# Patient Record
Sex: Male | Born: 1965 | Race: White | Hispanic: No | Marital: Married | Smoking: Current every day smoker
Health system: Southern US, Community
[De-identification: ages and names within clinical notes are randomized; demographics above are authoritative.]

## PROBLEM LIST (undated history)

## (undated) DIAGNOSIS — Z789 Other specified health status: Secondary | ICD-10-CM

## (undated) HISTORY — PX: OTHER SURGICAL HISTORY: SHX169

## (undated) HISTORY — PX: BACK SURGERY: SHX140

---

## 2004-01-28 ENCOUNTER — Ambulatory Visit (HOSPITAL_COMMUNITY): Admission: RE | Admit: 2004-01-28 | Discharge: 2004-01-29 | Payer: Self-pay | Admitting: Neurosurgery

## 2006-03-13 ENCOUNTER — Emergency Department (HOSPITAL_COMMUNITY): Admission: EM | Admit: 2006-03-13 | Discharge: 2006-03-13 | Payer: Self-pay | Admitting: Emergency Medicine

## 2006-10-18 ENCOUNTER — Ambulatory Visit (HOSPITAL_COMMUNITY): Admission: RE | Admit: 2006-10-18 | Discharge: 2006-10-19 | Payer: Self-pay | Admitting: Neurosurgery

## 2011-02-22 ENCOUNTER — Emergency Department (INDEPENDENT_AMBULATORY_CARE_PROVIDER_SITE_OTHER): Payer: Self-pay

## 2011-02-22 ENCOUNTER — Emergency Department (HOSPITAL_BASED_OUTPATIENT_CLINIC_OR_DEPARTMENT_OTHER)
Admission: EM | Admit: 2011-02-22 | Discharge: 2011-02-22 | Disposition: A | Discharge status: Home / Self Care | Payer: Self-pay | Attending: Emergency Medicine | Admitting: Emergency Medicine

## 2011-02-22 DIAGNOSIS — R079 Chest pain, unspecified: Secondary | ICD-10-CM

## 2011-02-22 DIAGNOSIS — F17200 Nicotine dependence, unspecified, uncomplicated: Secondary | ICD-10-CM

## 2011-02-22 DIAGNOSIS — R071 Chest pain on breathing: Secondary | ICD-10-CM | POA: Insufficient documentation

## 2011-02-22 DIAGNOSIS — G8929 Other chronic pain: Secondary | ICD-10-CM | POA: Insufficient documentation

## 2011-02-22 LAB — POCT CARDIAC MARKERS
CKMB, poc: <1 ng/mL — ABNORMAL LOW (ref 1.0–8.0)
CKMB, poc: <1 ng/mL — ABNORMAL LOW (ref 1.0–8.0)
CKMB, poc: <1 ng/mL — ABNORMAL LOW (ref 1.0–8.0)
Myoglobin, poc: 44.4 ng/mL (ref 12–200)
Troponin i, poc: <0.05 ng/mL (ref 0.00–0.09)
Troponin i, poc: <0.05 ng/mL (ref 0.00–0.09)

## 2011-02-22 LAB — BASIC METABOLIC PANEL
BUN: 17 mg/dL (ref 6–23)
GFR calc Af Amer: >60 mL/min (ref >60)
Potassium: 4 mEq/L (ref 3.5–5.1)

## 2011-02-22 LAB — CBC
Hemoglobin: 14.9 g/dL (ref 12.0–15.0)
MCHC: 34.2 g/dL (ref 30.0–36.0)
Platelets: 390 10*3/uL (ref 150–400)
RBC: 5 MIL/uL (ref 3.87–5.11)

## 2011-05-27 ENCOUNTER — Encounter (HOSPITAL_COMMUNITY)
Admission: RE | Admit: 2011-05-27 | Discharge: 2011-05-27 | Disposition: A | Discharge status: Home / Self Care | Payer: 59 | Source: Ambulatory Visit | Attending: Neurosurgery | Admitting: Neurosurgery

## 2011-05-27 LAB — CBC
HCT: 40.4 % (ref 39.0–52.0)
Hemoglobin: 13.8 g/dL (ref 13.0–17.0)
MCH: 30.4 pg (ref 26.0–34.0)
MCHC: 34.2 g/dL (ref 30.0–36.0)
MCV: 89 fL (ref 78.0–100.0)
Platelets: 414 10*3/uL — ABNORMAL HIGH (ref 150–400)
RBC: 4.54 MIL/uL (ref 4.22–5.81)
RDW: 12.7 % (ref 11.5–15.5)
WBC: 8.8 10*3/uL (ref 4.0–10.5)

## 2011-05-27 LAB — SURGICAL PCR SCREEN
MRSA, PCR: NEGATIVE
Staphylococcus aureus: NEGATIVE

## 2011-06-01 ENCOUNTER — Ambulatory Visit (HOSPITAL_COMMUNITY)
Admission: RE | Admit: 2011-06-01 | Discharge: 2011-06-02 | Disposition: A | Discharge status: Home / Self Care | Payer: 59 | Source: Ambulatory Visit | Attending: Neurosurgery | Admitting: Neurosurgery

## 2011-06-01 ENCOUNTER — Ambulatory Visit (HOSPITAL_COMMUNITY): Payer: 59

## 2011-06-01 DIAGNOSIS — M51379 Other intervertebral disc degeneration, lumbosacral region without mention of lumbar back pain or lower extremity pain: Secondary | ICD-10-CM | POA: Insufficient documentation

## 2011-06-01 DIAGNOSIS — Z01812 Encounter for preprocedural laboratory examination: Secondary | ICD-10-CM | POA: Insufficient documentation

## 2011-06-01 DIAGNOSIS — M47817 Spondylosis without myelopathy or radiculopathy, lumbosacral region: Secondary | ICD-10-CM | POA: Insufficient documentation

## 2011-06-01 DIAGNOSIS — M5126 Other intervertebral disc displacement, lumbar region: Secondary | ICD-10-CM | POA: Insufficient documentation

## 2011-06-01 DIAGNOSIS — M5137 Other intervertebral disc degeneration, lumbosacral region: Secondary | ICD-10-CM | POA: Insufficient documentation

## 2011-06-06 NOTE — Op Note (Signed)
Jason Morrison, OTTEN NO.:  [id number]  MEDICAL RECORD NO.:  [id number]  LOCATION:  3526                         FACILITY:  [facility]  PHYSICIAN:  Hewitt Shorts, M.D.DATE OF BIRTH:  01-Aug-1966  DATE OF PROCEDURE:  06/01/2011 DATE OF DISCHARGE:                              OPERATIVE REPORT   PREOPERATIVE DIAGNOSES:  Recurrent left L5-S1 lumbar disk herniation, lumbar degenerative disk disease, lumbar spondylosis, and lumbar radiculopathy.  POSTOPERATIVE DIAGNOSES:  Recurrent left L5-S1 lumbar disk herniation, lumbar degenerative disk disease, lumbar spondylosis, and lumbar radiculopathy.  PROCEDURE:  Left L5-S1 lumbar laminotomy, microdiskectomy with microdissection and the operating microscope.  SURGEON:  Hewitt Shorts, MD  ASSISTANT:  Danae Orleans. Venetia Maxon, MD  ANESTHESIA:  General endotracheal.  INDICATIONS:  The patient is a 45 year old man who presented with recurrent left lumbar radicular pain.  MRI scan revealed a large disk herniation migrated caudally behind the body of S1 and a decision was made to proceed with laminotomy and microdiskectomy.  PROCEDURE IN DETAIL:  The patient was brought to the operating room and placed under general endotracheal anesthesia.  The patient was turned to prone position.  Lumbar region was prepped with Betadine soap and solution and draped in a sterile fashion.  The previous midline incision was identified.  The underlying skin and subcutaneous tissue were infiltrated with local anesthetic with epinephrine.  An x-ray was taken. Localization at the L5-S1 level was confirmed and then a midline incision made in the line of the previous midline incision and carried down through the subcutaneous tissue.  Bipolar cautery and electrocautery was used to maintain hemostasis.  Dissection was carried down to the lumbar fascia which was incised on the left side of midline. The paraspinal muscles were dissected in spinous  process and lamina in a subperiosteal fashion.  The L5-S1 interlaminar space was identified. Another x-ray was taken to confirm the localization and then we defined the superior edge of the S1 lamina.  The scar tissue was dissected from that edge and then we began a laminotomy.  The operating microscope was draped and brought into the field to provide additional navigation, illumination, and visualization.  The remainder of decompression was performed using microdissection and microsurgical technique.  Using the high-speed drill and Kerrison punches, the S1 laminotomy and a medial facetectomy was performed.  There was extensive scar tissue but we were able to identify the S1 pedicle and the S1 nerve roots and gradually dissected laterally identifying the lateral aspect of the annulus of the L5-S1 disk space and then began to carefully mobilize the thecal sac and nerve root medially freeing up scar tissue and adherence between the neural structures and the surrounding structures.  As we mobilized the thecal sac and nerve root medially, we further visualized the annulus at L5-S1.  It was incised and entry to the disk space was markedly narrowed, but as we did this we were able to further mobilize the thecal sac and nerve root medially.  Eventually, we were able to dissect through some of the ventral epidural scar tissue and a herniated disk fragment was identified.  We began to mobilize it and removed a large fragment by  gently teasing it out from around the surrounding scar tissue.  This decompressed the thecal sac and nerve roots ventrally and it was felt that good decompression of the thecal sac and nerve root had been achieved by removing the large fragments.  We entered the disk space again and removed any additional loose degenerated piece of the disk material directly in our exposure and then the wound was irrigated bacitracin solution, checked for hemostasis, and was established  with the use of bipolar cautery as needed.  We did Valsalva the patient and there was no evidence of CSF leakage with the Valsalva maneuver and then we proceeded with closure.  Deep fascia closed with interrupted undyed 1- 0 Vicryl sutures.  Scarpa fascia closed with interrupted inverted 2-0 undyed Vicryl sutures.  Subcutaneous subcuticular closed with inverted 2- 0 and 3-0 undyed Vicryl sutures.  Skin was approximated with Dermabond. The procedure was tolerated well.  Estimated blood loss was less than 25 mL.  Sponge and needle count correct.  Following surgery, the patient was to be turned back to supine position, reversed from the anesthetic, extubated, and transferred to the recovery room for further care.     Hewitt Shorts, M.D.     RWN/MEDQ  D:  06/01/2011  T:  06/01/2011  Job:  [id number]  Electronically Signed by Shirlean Kelly M.D. on 06/06/2011 07:36:16 AM

## 2012-09-10 ENCOUNTER — Other Ambulatory Visit: Payer: Self-pay | Admitting: Neurosurgery

## 2012-09-11 ENCOUNTER — Encounter (HOSPITAL_COMMUNITY): Payer: Self-pay | Admitting: Pharmacy Technician

## 2012-09-13 NOTE — Pre-Procedure Instructions (Signed)
20 VARNELL ORVIS  09/13/2012   Your procedure is scheduled on:  09/17/12  Report to [facility] at 530 AM.  Call this number if you have problems the morning of surgery: [phone]   Remember:   Do not eat food:After Midnight.     Take these medicines the morning of surgery with A SIP OF WATER: hydrocodone,zantac,ultram   Do not wear jewelry, make-up or nail polish.  Do not wear lotions, powders, or perfumes. You may wear deodorant.  Do not shave 48 hours prior to surgery. Men may shave face and neck.  Do not bring valuables to the hospital.  Contacts, dentures or bridgework may not be worn into surgery.  Leave suitcase in the car. After surgery it may be brought to your room.  For patients admitted to the hospital, checkout time is 11:00 AM the day of discharge.   Patients discharged the day of surgery will not be allowed to drive home.  Name and phone number of your driver: family  Special Instructions: Shower using CHG 2 nights before surgery and the night before surgery.  If you shower the day of surgery use CHG.  Use special wash - you have one bottle of CHG for all showers.  You should use approximately 1/3 of the bottle for each shower.   Please read over the following fact sheets that you were given: Pain Booklet, Coughing and Deep Breathing, Surgical Site Infection Prevention and Anesthesia Post-op Instructions

## 2012-09-14 ENCOUNTER — Encounter (HOSPITAL_COMMUNITY): Payer: Self-pay

## 2012-09-14 ENCOUNTER — Encounter (HOSPITAL_COMMUNITY)
Admission: RE | Admit: 2012-09-14 | Discharge: 2012-09-14 | Disposition: A | Discharge status: Home / Self Care | Payer: 59 | Source: Ambulatory Visit | Attending: Neurosurgery | Admitting: Neurosurgery

## 2012-09-14 HISTORY — DX: Other specified health status: Z78.9

## 2012-09-14 LAB — BASIC METABOLIC PANEL
BUN: 9 mg/dL (ref 6–23)
CO2: 26 mEq/L (ref 19–32)
Calcium: 9.4 mg/dL (ref 8.4–10.5)
Chloride: 99 mEq/L (ref 96–112)
Creatinine, Ser: 0.82 mg/dL (ref 0.50–1.35)
GFR calc Af Amer: >90 mL/min (ref >90)
GFR calc non Af Amer: >90 mL/min (ref >90)
Glucose, Bld: 88 mg/dL (ref 70–99)
Potassium: 3.7 mEq/L (ref 3.5–5.1)
Sodium: 138 mEq/L (ref 135–145)

## 2012-09-14 LAB — CBC
HCT: 45.3 % (ref 39.0–52.0)
Hemoglobin: 15.2 g/dL (ref 13.0–17.0)
MCH: 30.9 pg (ref 26.0–34.0)
MCHC: 33.6 g/dL (ref 30.0–36.0)
MCV: 92.1 fL (ref 78.0–100.0)
Platelets: 338 10*3/uL (ref 150–400)
RBC: 4.92 MIL/uL (ref 4.22–5.81)
RDW: 12.6 % (ref 11.5–15.5)
WBC: 9.3 10*3/uL (ref 4.0–10.5)

## 2012-09-14 LAB — SURGICAL PCR SCREEN
MRSA, PCR: NEGATIVE
Staphylococcus aureus: NEGATIVE

## 2012-09-16 MED ORDER — CEFAZOLIN SODIUM-DEXTROSE 2-3 GM-% IV SOLR
2.0000 g | INTRAVENOUS | Status: AC
  Administered 2012-09-17: 2 g via INTRAVENOUS
  Filled 2012-09-16: qty 50

## 2012-09-17 ENCOUNTER — Encounter (HOSPITAL_COMMUNITY): Payer: Self-pay | Admitting: *Deleted

## 2012-09-17 ENCOUNTER — Ambulatory Visit (HOSPITAL_COMMUNITY): Payer: 59 | Admitting: Certified Registered Nurse Anesthetist

## 2012-09-17 ENCOUNTER — Observation Stay (HOSPITAL_COMMUNITY)
Admission: RE | Admit: 2012-09-17 | Discharge: 2012-09-18 | Disposition: A | Discharge status: Home / Self Care | Payer: 59 | Source: Ambulatory Visit | Attending: Neurosurgery | Admitting: Neurosurgery

## 2012-09-17 ENCOUNTER — Encounter (HOSPITAL_COMMUNITY): Payer: Self-pay | Admitting: Certified Registered Nurse Anesthetist

## 2012-09-17 ENCOUNTER — Ambulatory Visit (HOSPITAL_COMMUNITY): Payer: 59

## 2012-09-17 ENCOUNTER — Encounter (HOSPITAL_COMMUNITY)
Admission: RE | Disposition: A | Discharge status: Home / Self Care | Payer: Self-pay | Source: Ambulatory Visit | Attending: Neurosurgery

## 2012-09-17 DIAGNOSIS — J4489 Other specified chronic obstructive pulmonary disease: Secondary | ICD-10-CM | POA: Insufficient documentation

## 2012-09-17 DIAGNOSIS — M5020 Other cervical disc displacement, unspecified cervical region: Principal | ICD-10-CM | POA: Insufficient documentation

## 2012-09-17 DIAGNOSIS — F17200 Nicotine dependence, unspecified, uncomplicated: Secondary | ICD-10-CM | POA: Insufficient documentation

## 2012-09-17 DIAGNOSIS — M5030 Other cervical disc degeneration, unspecified cervical region: Secondary | ICD-10-CM | POA: Insufficient documentation

## 2012-09-17 DIAGNOSIS — Z01812 Encounter for preprocedural laboratory examination: Secondary | ICD-10-CM | POA: Insufficient documentation

## 2012-09-17 DIAGNOSIS — M47812 Spondylosis without myelopathy or radiculopathy, cervical region: Secondary | ICD-10-CM | POA: Insufficient documentation

## 2012-09-17 DIAGNOSIS — J449 Chronic obstructive pulmonary disease, unspecified: Secondary | ICD-10-CM | POA: Insufficient documentation

## 2012-09-17 HISTORY — PX: ANTERIOR CERVICAL DECOMP/DISCECTOMY FUSION: SHX1161

## 2012-09-17 SURGERY — ANTERIOR CERVICAL DECOMPRESSION/DISCECTOMY FUSION 2 LEVELS
Anesthesia: General | Wound class: Clean

## 2012-09-17 MED ORDER — PROPOFOL 10 MG/ML IV BOLUS
INTRAVENOUS | Status: DC | PRN
  Administered 2012-09-17: 150 mg via INTRAVENOUS

## 2012-09-17 MED ORDER — KETOROLAC TROMETHAMINE 30 MG/ML IJ SOLN
30.0000 mg | Freq: Four times a day (QID) | INTRAMUSCULAR | Status: DC
  Administered 2012-09-17 – 2012-09-18 (×3): 30 mg via INTRAVENOUS
  Filled 2012-09-17 (×7): qty 1

## 2012-09-17 MED ORDER — BACITRACIN 50000 UNITS IM SOLR
INTRAMUSCULAR | Status: AC
  Filled 2012-09-17: qty 1

## 2012-09-17 MED ORDER — ROCURONIUM BROMIDE 100 MG/10ML IV SOLN
INTRAVENOUS | Status: DC | PRN
  Administered 2012-09-17: 50 mg via INTRAVENOUS

## 2012-09-17 MED ORDER — SODIUM CHLORIDE 0.9 % IJ SOLN: 3.0000 mL | INTRAMUSCULAR | Status: DC | PRN

## 2012-09-17 MED ORDER — OXYCODONE HCL 5 MG/5ML PO SOLN: 5.0000 mg | Freq: Once | ORAL | Status: AC | PRN

## 2012-09-17 MED ORDER — GLYCOPYRROLATE 0.2 MG/ML IJ SOLN
INTRAMUSCULAR | Status: DC | PRN
  Administered 2012-09-17: 0.6 mg via INTRAVENOUS

## 2012-09-17 MED ORDER — ALUM & MAG HYDROXIDE-SIMETH 200-200-20 MG/5ML PO SUSP: 30.0000 mL | Freq: Four times a day (QID) | ORAL | Status: DC | PRN

## 2012-09-17 MED ORDER — LIDOCAINE-EPINEPHRINE 1 %-1:100000 IJ SOLN
INTRAMUSCULAR | Status: DC | PRN
  Administered 2012-09-17: 6 mL

## 2012-09-17 MED ORDER — MIDAZOLAM HCL 5 MG/5ML IJ SOLN
INTRAMUSCULAR | Status: DC | PRN
  Administered 2012-09-17: 2 mg via INTRAVENOUS

## 2012-09-17 MED ORDER — FENTANYL CITRATE 0.05 MG/ML IJ SOLN
INTRAMUSCULAR | Status: DC | PRN
  Administered 2012-09-17: 100 ug via INTRAVENOUS
  Administered 2012-09-17: 250 ug via INTRAVENOUS
  Administered 2012-09-17 (×3): 50 ug via INTRAVENOUS

## 2012-09-17 MED ORDER — MEPERIDINE HCL 25 MG/ML IJ SOLN: 6.2500 mg | INTRAMUSCULAR | Status: DC | PRN

## 2012-09-17 MED ORDER — KETOROLAC TROMETHAMINE 30 MG/ML IJ SOLN
INTRAMUSCULAR | Status: AC
  Filled 2012-09-17: qty 1

## 2012-09-17 MED ORDER — SODIUM CHLORIDE 0.9 % IJ SOLN
3.0000 mL | Freq: Two times a day (BID) | INTRAMUSCULAR | Status: DC
  Administered 2012-09-17 (×2): 3 mL via INTRAVENOUS

## 2012-09-17 MED ORDER — ACETAMINOPHEN 10 MG/ML IV SOLN
1000.0000 mg | Freq: Four times a day (QID) | INTRAVENOUS | Status: AC
  Administered 2012-09-17 – 2012-09-18 (×4): 1000 mg via INTRAVENOUS
  Filled 2012-09-17 (×4): qty 100

## 2012-09-17 MED ORDER — OXYCODONE HCL 5 MG PO TABS
5.0000 mg | ORAL_TABLET | ORAL | Status: DC | PRN
  Administered 2012-09-17 – 2012-09-18 (×2): 10 mg via ORAL
  Filled 2012-09-17 (×2): qty 2

## 2012-09-17 MED ORDER — NEOSTIGMINE METHYLSULFATE 1 MG/ML IJ SOLN
INTRAMUSCULAR | Status: DC | PRN
  Administered 2012-09-17: 4 mg via INTRAVENOUS

## 2012-09-17 MED ORDER — HYDROXYZINE HCL 25 MG PO TABS: 50.0000 mg | ORAL_TABLET | ORAL | Status: DC | PRN

## 2012-09-17 MED ORDER — PROMETHAZINE HCL 25 MG/ML IJ SOLN: 6.2500 mg | INTRAMUSCULAR | Status: DC | PRN

## 2012-09-17 MED ORDER — ACETAMINOPHEN 325 MG PO TABS: 650.0000 mg | ORAL_TABLET | ORAL | Status: DC | PRN

## 2012-09-17 MED ORDER — KETOROLAC TROMETHAMINE 30 MG/ML IJ SOLN
30.0000 mg | Freq: Once | INTRAMUSCULAR | Status: AC
  Administered 2012-09-17: 30 mg via INTRAVENOUS

## 2012-09-17 MED ORDER — MORPHINE SULFATE 4 MG/ML IJ SOLN: 4.0000 mg | INTRAMUSCULAR | Status: DC | PRN

## 2012-09-17 MED ORDER — SODIUM CHLORIDE 0.9 % IV SOLN
INTRAVENOUS | Status: AC
  Filled 2012-09-17: qty 500

## 2012-09-17 MED ORDER — BISACODYL 10 MG RE SUPP: 10.0000 mg | Freq: Every day | RECTAL | Status: DC | PRN

## 2012-09-17 MED ORDER — LACTATED RINGERS IV SOLN
INTRAVENOUS | Status: DC | PRN
  Administered 2012-09-17 (×2): via INTRAVENOUS

## 2012-09-17 MED ORDER — BUPIVACAINE HCL (PF) 0.25 % IJ SOLN
INTRAMUSCULAR | Status: DC | PRN
  Administered 2012-09-17: 6 mL

## 2012-09-17 MED ORDER — HYDROXYZINE HCL 50 MG/ML IM SOLN: 50.0000 mg | INTRAMUSCULAR | Status: DC | PRN

## 2012-09-17 MED ORDER — SODIUM CHLORIDE 0.9 % IR SOLN
Status: DC | PRN
  Administered 2012-09-17: 08:00:00

## 2012-09-17 MED ORDER — PHENOL 1.4 % MT LIQD: 1.0000 | OROMUCOSAL | Status: DC | PRN

## 2012-09-17 MED ORDER — CYCLOBENZAPRINE HCL 10 MG PO TABS: 10.0000 mg | ORAL_TABLET | Freq: Three times a day (TID) | ORAL | Status: DC | PRN

## 2012-09-17 MED ORDER — ONDANSETRON HCL 4 MG/2ML IJ SOLN
INTRAMUSCULAR | Status: DC | PRN
  Administered 2012-09-17: 4 mg via INTRAVENOUS

## 2012-09-17 MED ORDER — DIAZEPAM 5 MG/ML IJ SOLN
INTRAMUSCULAR | Status: AC
  Filled 2012-09-17: qty 2

## 2012-09-17 MED ORDER — ZOLPIDEM TARTRATE 5 MG PO TABS: 10.0000 mg | ORAL_TABLET | Freq: Every evening | ORAL | Status: DC | PRN

## 2012-09-17 MED ORDER — THROMBIN 5000 UNITS EX SOLR
CUTANEOUS | Status: DC | PRN
  Administered 2012-09-17 (×5): 5000 [IU] via TOPICAL

## 2012-09-17 MED ORDER — ACETAMINOPHEN 10 MG/ML IV SOLN
INTRAVENOUS | Status: AC
  Filled 2012-09-17: qty 100

## 2012-09-17 MED ORDER — MIDAZOLAM HCL 2 MG/2ML IJ SOLN: 0.5000 mg | Freq: Once | INTRAMUSCULAR | Status: DC | PRN

## 2012-09-17 MED ORDER — HYDROMORPHONE HCL PF 1 MG/ML IJ SOLN
INTRAMUSCULAR | Status: AC
  Filled 2012-09-17: qty 1

## 2012-09-17 MED ORDER — OXYCODONE HCL 5 MG PO TABS
ORAL_TABLET | ORAL | Status: AC
  Filled 2012-09-17: qty 1

## 2012-09-17 MED ORDER — DEXAMETHASONE SODIUM PHOSPHATE 4 MG/ML IJ SOLN
INTRAMUSCULAR | Status: DC | PRN
  Administered 2012-09-17: 8 mg via INTRAVENOUS

## 2012-09-17 MED ORDER — KCL IN DEXTROSE-NACL 20-5-0.45 MEQ/L-%-% IV SOLN
INTRAVENOUS | Status: DC
  Filled 2012-09-17 (×5): qty 1000

## 2012-09-17 MED ORDER — ACETAMINOPHEN 650 MG RE SUPP: 650.0000 mg | RECTAL | Status: DC | PRN

## 2012-09-17 MED ORDER — HYDROMORPHONE HCL PF 1 MG/ML IJ SOLN
0.2500 mg | INTRAMUSCULAR | Status: DC | PRN
  Administered 2012-09-17 (×4): 0.5 mg via INTRAVENOUS

## 2012-09-17 MED ORDER — 0.9 % SODIUM CHLORIDE (POUR BTL) OPTIME
TOPICAL | Status: DC | PRN
  Administered 2012-09-17: 1000 mL

## 2012-09-17 MED ORDER — MENTHOL 3 MG MT LOZG: 1.0000 | LOZENGE | OROMUCOSAL | Status: DC | PRN

## 2012-09-17 MED ORDER — OXYCODONE HCL 5 MG PO TABS
5.0000 mg | ORAL_TABLET | Freq: Once | ORAL | Status: AC | PRN
  Administered 2012-09-17: 5 mg via ORAL

## 2012-09-17 MED ORDER — DIAZEPAM 5 MG/ML IJ SOLN
2.5000 mg | Freq: Once | INTRAMUSCULAR | Status: AC
  Administered 2012-09-17: 2.5 mg via INTRAVENOUS

## 2012-09-17 MED ORDER — LIDOCAINE HCL (CARDIAC) 20 MG/ML IV SOLN
INTRAVENOUS | Status: DC | PRN
  Administered 2012-09-17: 20 mg via INTRAVENOUS

## 2012-09-17 MED ORDER — HEMOSTATIC AGENTS (NO CHARGE) OPTIME
TOPICAL | Status: DC | PRN
  Administered 2012-09-17 (×2): 1 via TOPICAL

## 2012-09-17 MED ORDER — MAGNESIUM HYDROXIDE 400 MG/5ML PO SUSP: 30.0000 mL | Freq: Every day | ORAL | Status: DC | PRN

## 2012-09-17 SURGICAL SUPPLY — 54 items
BAG DECANTER FOR FLEXI CONT (MISCELLANEOUS) ×2 IMPLANT
BIT DRILL NEURO 2X3.1 SFT TUCH (MISCELLANEOUS) ×1 IMPLANT
BLADE ULTRA TIP 2M (BLADE) ×2 IMPLANT
BRUSH SCRUB EZ PLAIN DRY (MISCELLANEOUS) ×2 IMPLANT
CANISTER SUCTION 2500CC (MISCELLANEOUS) ×2 IMPLANT
CLOTH BEACON ORANGE TIMEOUT ST (SAFETY) ×2 IMPLANT
CONT SPEC 4OZ CLIKSEAL STRL BL (MISCELLANEOUS) ×2 IMPLANT
COVER MAYO STAND STRL (DRAPES) ×2 IMPLANT
DECANTER SPIKE VIAL GLASS SM (MISCELLANEOUS) ×2 IMPLANT
DERMABOND ADVANCED (GAUZE/BANDAGES/DRESSINGS) ×2
DERMABOND ADVANCED .7 DNX12 (GAUZE/BANDAGES/DRESSINGS) ×2 IMPLANT
DRAPE LAPAROTOMY 100X72 PEDS (DRAPES) ×2 IMPLANT
DRAPE MICROSCOPE LEICA (MISCELLANEOUS) ×2 IMPLANT
DRAPE POUCH INSTRU U-SHP 10X18 (DRAPES) ×2 IMPLANT
DRAPE PROXIMA HALF (DRAPES) ×2 IMPLANT
DRILL NEURO 2X3.1 SOFT TOUCH (MISCELLANEOUS) ×2
ELECT COATED BLADE 2.86 ST (ELECTRODE) ×2 IMPLANT
ELECT REM PT RETURN 9FT ADLT (ELECTROSURGICAL) ×2
ELECTRODE REM PT RTRN 9FT ADLT (ELECTROSURGICAL) ×1 IMPLANT
GLOVE BIO SURGEON STRL SZ8 (GLOVE) ×2 IMPLANT
GLOVE BIOGEL PI IND STRL 7.5 (GLOVE) ×2 IMPLANT
GLOVE BIOGEL PI IND STRL 8 (GLOVE) ×1 IMPLANT
GLOVE BIOGEL PI INDICATOR 7.5 (GLOVE) ×2
GLOVE BIOGEL PI INDICATOR 8 (GLOVE) ×1
GLOVE ECLIPSE 7.5 STRL STRAW (GLOVE) ×2 IMPLANT
GLOVE EXAM NITRILE LRG STRL (GLOVE) IMPLANT
GLOVE EXAM NITRILE MD LF STRL (GLOVE) ×2 IMPLANT
GLOVE EXAM NITRILE XL STR (GLOVE) IMPLANT
GLOVE EXAM NITRILE XS STR PU (GLOVE) IMPLANT
GLOVE INDICATOR 7.0 STRL GRN (GLOVE) ×4 IMPLANT
GLOVE INDICATOR 8.5 STRL (GLOVE) ×2 IMPLANT
GOWN BRE IMP SLV AUR LG STRL (GOWN DISPOSABLE) ×2 IMPLANT
GOWN BRE IMP SLV AUR XL STRL (GOWN DISPOSABLE) ×2 IMPLANT
GOWN STRL REIN 2XL LVL4 (GOWN DISPOSABLE) IMPLANT
GRAFT CORT CANC 14X8.25X11 5D (Bone Implant) ×4 IMPLANT
HEAD HALTER (SOFTGOODS) ×2 IMPLANT
HEMOSTAT POWDER KIT SURGIFOAM (HEMOSTASIS) ×2 IMPLANT
KIT BASIN OR (CUSTOM PROCEDURE TRAY) ×2 IMPLANT
KIT ROOM TURNOVER OR (KITS) ×2 IMPLANT
NEEDLE HYPO 25X1 1.5 SAFETY (NEEDLE) ×2 IMPLANT
NEEDLE SPNL 22GX3.5 QUINCKE BK (NEEDLE) ×4 IMPLANT
NS IRRIG 1000ML POUR BTL (IV SOLUTION) ×2 IMPLANT
PACK LAMINECTOMY NEURO (CUSTOM PROCEDURE TRAY) ×2 IMPLANT
PAD ARMBOARD 7.5X6 YLW CONV (MISCELLANEOUS) ×6 IMPLANT
RUBBERBAND STERILE (MISCELLANEOUS) ×4 IMPLANT
SPONGE INTESTINAL PEANUT (DISPOSABLE) ×2 IMPLANT
SPONGE SURGIFOAM ABS GEL SZ50 (HEMOSTASIS) ×2 IMPLANT
STAPLER SKIN PROX WIDE 3.9 (STAPLE) ×2 IMPLANT
SUT VIC AB 2-0 CP2 18 (SUTURE) ×2 IMPLANT
SUT VIC AB 3-0 SH 8-18 (SUTURE) ×2 IMPLANT
SYR 20ML ECCENTRIC (SYRINGE) ×2 IMPLANT
TOWEL OR 17X24 6PK STRL BLUE (TOWEL DISPOSABLE) ×2 IMPLANT
TOWEL OR 17X26 10 PK STRL BLUE (TOWEL DISPOSABLE) ×2 IMPLANT
WATER STERILE IRR 1000ML POUR (IV SOLUTION) ×2 IMPLANT

## 2012-09-17 NOTE — Plan of Care (Signed)
Problem: Consults Goal: Diagnosis - Spinal Surgery Outcome: Completed/Met Date Met:  09/17/12 Cervical Spine Fusion     

## 2012-09-17 NOTE — H&P (Signed)
Subjective: Patient is a 46 y.o. male who is admitted for treatment of right cervical radiculopathy with weakness of the intrinsics of the right hand consistent with a right C8 cervical radiculopathy. Patient is status post a previous C6-7 ACDF which is healed well. MRI scan shows at C5-6 a large disc and osteophyte complex central to the right compressing the ventral aspect of the spinal cord on the right side and extending into the right C5-6 neural foramen and at C7-T1 bilateral os encroachment neural foramina, right worse than left. Patient is admitted now for a 2 level C5-6 and if feasible, a C7-T1 anterior cervical decompression arthrodesis with bone graft and cervical plating.   Past Medical History  Diagnosis Date  . No pertinent past medical history     Past Surgical History  Procedure Date  . Back surgery     x 2 lumbars  surgeries  . Neck fusion     Prescriptions prior to admission  Medication Sig Dispense Refill  . HYDROcodone-acetaminophen (NORCO/VICODIN) 5-325 MG per tablet Take 1 tablet by mouth 3 (three) times daily.      . Ranitidine HCl (ZANTAC PO) Take 1 tablet by mouth daily after breakfast. For acid indigestion      . traMADol (ULTRAM) 50 MG tablet Take 50 mg by mouth 4 (four) times daily.       Allergies  Allergen Reactions  . Ibuprofen Hives  . Sulfa Antibiotics Hives    History  Substance Use Topics  . Smoking status: Current Every Day Smoker -- 0.5 packs/day for 17 years  . Smokeless tobacco: Not on file  . Alcohol Use: No    History reviewed. No pertinent family history.   Review of Systems A comprehensive review of systems was negative.  Objective: Vital signs in last 24 hours: Temp:  [98.1 F (36.7 C)] 98.1 F (36.7 C) (10/28 0656) Pulse Rate:  [88] 88  (10/28 0656) Resp:  [20] 20  (10/28 0656) BP: (126)/(85) 126/85 mmHg (10/28 0656) SpO2:  [97 %] 97 % (10/28 0656)  EXAM: Patient is a well-developed well-nourished white male in no acute  distress. Lungs are clear to auscultation , the patient has symmetrical respiratory excursion. Heart has a regular rate and rhythm normal S1 and S2 no murmur.   Abdomen is soft nontender nondistended bowel sounds are present. Extremity examination shows no clubbing cyanosis or edema. Musculoskeletal examination shows no tenderness to palpation of the cervical spinous processes or paracervical musculature, but he has a diminished range of motion neck, particularly in lateral flexion to the right, secondary to pain. Neurologic examination shows strength as far 5 in the deltoid, biceps, and triceps bilaterally, as well as in the left intrinsics and grip. Her the right intrinsics are 4 minus to 4 over 5, the right grip is 5 over 5. Sensation is diminished to pinprick in the fourth and fifth digits of the right hand. Reflexes are trace in the biceps, brachioradialis, triceps, 2 in the quadriceps, they are all symmetrical bilaterally. Gastrocnemius on the left is minimal, and on the right is 2. Toes are downgoing bilaterally. He has a normal gait and stance.  Data Review:CBC    Component Value Date/Time   WBC 9.3 09/14/2012 1513   RBC 4.92 09/14/2012 1513   HGB 15.2 09/14/2012 1513   HCT 45.3 09/14/2012 1513   PLT 338 09/14/2012 1513   MCV 92.1 09/14/2012 1513   MCH 30.9 09/14/2012 1513   MCHC 33.6 09/14/2012 1513   RDW 12.6  09/14/2012 1513                          BMET    Component Value Date/Time   NA 138 09/14/2012 1513   K 3.7 09/14/2012 1513   CL 99 09/14/2012 1513   CO2 26 09/14/2012 1513   GLUCOSE 88 09/14/2012 1513   BUN 9 09/14/2012 1513   CREATININE 0.82 09/14/2012 1513   CALCIUM 9.4 09/14/2012 1513   GFRNONAA >90 09/14/2012 1513   GFRAA >90 09/14/2012 1513     Assessment/Plan: Patient with multilevel degenerative changes in the cervical spine with associated spondylitic disc herniation, which presented with a right C8 radiculopathy, with weakness of the intrinsics of the right  hand. He's been found to have significant degenerative changes at the C5-6 and C7-T1 levels with neural compression, and a solid fusion at C6-7. He is admitted now for 2 level C5-6 and C7-T1 ACDF. He understands that region the C7-T1 level may be difficult, and the approach made to be aborted, and ultimately he may require a posterior decompression at C7-T1. I've discussed with the patient the nature of his condition, the nature the surgical procedure, the typical length of surgery, hospital stay, and overall recuperation. We discussed limitations postoperatively. I discussed risks of surgery including risks of infection, bleeding, possibly need for transfusion, the risk of nerve root dysfunction with pain, weakness, numbness, or paresthesias, the risk of spinal cord dysfunction with paralysis of all 4 limbs and quadriplegia, and the risk of dural tear and CSF leakage and possible need for further surgery, the risk of esophageal dysfunction causing dysphagia and the risk of laryngeal dysfunction causing hoarseness of the voice, the risk of failure of the arthrodesis and the possible need for further surgery, and the risk of anesthetic complications including myocardial infarction, stroke, pneumonia, and death. We also discussed the need for postoperative immobilization in a cervical collar. Understanding all this the patient does wish to proceed with surgery and is admitted for such.    Hewitt Shorts, MD 09/17/2012 7:12 AM

## 2012-09-17 NOTE — Op Note (Signed)
09/17/2012  11:09 AM  PATIENT:  Jason Morrison  46 y.o. male  PRE-OPERATIVE DIAGNOSIS:  cervical herniated disc cervical spondylosis cervical degenerative disc disease cervical radiculopathy  POST-OPERATIVE DIAGNOSIS:  cervical herniated disc cervical spondylosis cervical degenerative disc disease cervical radiculopathy  PROCEDURE:  Procedure(s): ANTERIOR CERVICAL DECOMPRESSION/DISCECTOMY FUSION 2 LEVELS:  C5-6 and C7-T1 anterior cervical decompression arthrodesis with allograft and tether cervical plating  SURGEON:  Surgeon(s): Hewitt Shorts, MD Mariam Dollar, MD  ASSISTANTS: Donalee Citrin, M.D.  ANESTHESIA:   general  EBL:  Total I/O In: 1700 [I.V.:1700] Out: 100 [Blood:100]  BLOOD ADMINISTERED:none  COUNT: Correct per nursing staff  DICTATION: Patient was brought to the operating room placed under general endotracheal anesthesia. Patient was placed in 10 pounds of halter traction. The neck was prepped with Betadine soap and solution and draped in a sterile fashion. A oblique incision was made on the left side of the neck. The line of the incision was infiltrated with local anesthetic with epinephrine. Dissection was carried down thru the subcutaneous tissue and platysma, bipolar cautery was used to maintain hemostasis. Dissection was then carried out thru an avascular plane leaving the sternocleidomastoid carotid artery and jugular vein laterally and the trachea and esophagus medially. The ventral aspect of the vertebral column was identified and we localized by identifying the patient's previous tether plate at the Z6-1 level. The C5-6 level was identified above the plate, and the [id number] was identified below the plate. We removed the plate at the W1-1 level, by first removing each of the screws, and then removing the plate itself. Each of the screw holes was packed with Gelfoam and thrombin to establish hemostasis. The annulus at C5-6 and C7-T1 levels were incised and the disc  space entered. Discectomy was performed with micro-curettes and pituitary rongeurs. The operating microscope was draped and brought into the field provided additional magnification illumination and visualization. Discectomy was continued posteriorly thru the disc space and then the cartilaginous endplate was removed using micro-curettes along with the high-speed drill. Posterior osteophytic overgrowth was removed each level using the high-speed drill along with a 2 mm thin footplated Kerrison punch. Posterior longitudinal ligament along with disc herniation was carefully removed, decompressing the spinal canal and thecal sac. We then continued to remove osteophytic overgrowth and disc material decompressing the neural foramina and exiting nerve roots bilaterally. Once the decompression was completed hemostasis was established at each level with the use of Gelfoam with thrombin and bipolar cautery. The Gelfoam was removed the wound irrigated and hemostasis confirmed. We then measured the height of the intravertebral disc space level and selected a 8 millimeter in height structural allograft for the C5-6 level and a 8 millimeter in height structural allograft for the  C7-T1 level . Each was hydrated and saline solution and then gently positioned in the intravertebral disc space and countersunk. We then selected a 20 millimeter in height Tether cervical plate for the B1-4 level. It was positioned over the fusion construct and secured to the vertebra with a pair of 4 x 15 mm variable screws at the C5 level, and a pair of 4.5 x 15 mm fixed screws at the C6 level, using the previous screw holes at the C6 level. Each of the new screw holes was started with the high-speed drill and then the screws placed, once all the screws were placed final tightening was performed. We then selected a 16 mm in height tether cervical plate for the N8-G9 level. He was positioned over  the fusion construct and secured to the vertebra with a  pair of 4.5 x 15 fixed screws at the C7 level, using the previous screw holes at the C7 level, and a pair of 4 x 15 mm variable screws at the T1 level. We again drilled the screw holes at T1 and placed the screws at that level. The wound was irrigated with bacitracin solution checked for hemostasis which was established with the use of bipolar cautery, Gelfoam with thrombin, and Surgifoam, and confirmed. An x-ray was taken which showed the graft in good position at the C5-6 level, and the screws in good position at both C5, C6, and C7, however we were unable to visualize the graft at C7-T1 nor the T1 screws. However under direct visualization the overall fusion construct at both levels look good. We then proceeded with closure. The platysma was closed with interrupted inverted 2-0 undyed Vicryl suture, the subcutaneous and subcuticular closed with interrupted inverted 3-0 undyed Vicryl suture. The skin edges were approximated with Dermabond. Following surgery the patient was taken out of cervical traction. To be reversed and the anesthetic and taken to the recovery room for further care.   PLAN OF CARE: Admit for overnight observation  PATIENT DISPOSITION:  PACU - hemodynamically stable.   Delay start of Pharmacological VTE agent (>24hrs) due to surgical blood loss or risk of bleeding:  yes    And will 1

## 2012-09-17 NOTE — Anesthesia Postprocedure Evaluation (Signed)
  Anesthesia Post-op Note  Patient: Jason Morrison  Procedure(s) Performed: Procedure(s) (LRB) with comments: ANTERIOR CERVICAL DECOMPRESSION/DISCECTOMY FUSION 2 LEVELS (N/A) - Cervical five -six Cervical seven thoracic one anterior cervical decompression with fusion plating and bonegraft  Patient Location: PACU  Anesthesia Type: No value filed.   Level of Consciousness: awake, alert , oriented and patient cooperative  Airway and Oxygen Therapy: Patient Spontanous Breathing and Patient connected to nasal cannula oxygen  Post-op Pain: mild  Post-op Assessment: Post-op Vital signs reviewed, Patient's Cardiovascular Status Stable, Respiratory Function Stable, Patent Airway, No signs of Nausea or vomiting and Pain level controlled  Post-op Vital Signs: Reviewed and stable  Complications: No apparent anesthesia complications

## 2012-09-17 NOTE — Anesthesia Preprocedure Evaluation (Addendum)
Anesthesia Evaluation  Patient identified by MRN, date of birth, ID band Patient awake    Reviewed: Allergy & Precautions, H&P , NPO status , Patient's Chart, lab work & pertinent test results, reviewed documented beta blocker date and time   History of Anesthesia Complications Negative for: history of anesthetic complications  Airway Mallampati: II TM Distance: >3 FB Neck ROM: Limited    Dental  (+) Teeth Intact, Dental Advisory Given, Implants and Caps   Pulmonary neg pulmonary ROS, COPDCurrent Smoker,  breath sounds clear to auscultation  Pulmonary exam normal       Cardiovascular Exercise Tolerance: Good negative cardio ROS  Rhythm:Regular Rate:Normal     Neuro/Psych R neck pain and numbness from R elbow down to hand Narcotic dependent negative psych ROS   GI/Hepatic Neg liver ROS, GERD-  Poorly Controlled and Medicated,  Endo/Other  negative endocrine ROS  Renal/GU negative Renal ROS     Musculoskeletal negative musculoskeletal ROS (+)   Abdominal   Peds  Hematology negative hematology ROS (+)   Anesthesia Other Findings   Reproductive/Obstetrics                          Anesthesia Physical Anesthesia Plan  ASA: II  Anesthesia Plan: General   Post-op Pain Management:    Induction: Intravenous  Airway Management Planned: Oral ETT  Additional Equipment:   Intra-op Plan:   Post-operative Plan: Extubation in OR  Informed Consent: I have reviewed the patients History and Physical, chart, labs and discussed the procedure including the risks, benefits and alternatives for the proposed anesthesia with the patient or authorized representative who has indicated his/her understanding and acceptance.   Dental advisory given  Plan Discussed with: CRNA and Surgeon  Anesthesia Plan Comments: (Plan routine monitors, GETA)        Anesthesia Quick Evaluation

## 2012-09-17 NOTE — Transfer of Care (Signed)
Immediate Anesthesia Transfer of Care Note  Patient: Jason Morrison  Procedure(s) Performed: Procedure(s) (LRB) with comments: ANTERIOR CERVICAL DECOMPRESSION/DISCECTOMY FUSION 2 LEVELS (N/A) - Cervical five -six Cervical seven thoracic one anterior cervical decompression with fusion plating and bonegraft  Patient Location: PACU  Anesthesia Type:No value filed. GENERAL  Level of Consciousness: awake, alert  and oriented  Airway & Oxygen Therapy: Patient Spontanous Breathing and Patient connected to face mask oxygen  Post-op Assessment: Report given to PACU RN, Post -op Vital signs reviewed and stable and Patient moving all extremities  Post vital signs: Reviewed and stable  Complications: No apparent anesthesia complications

## 2012-09-18 MED ORDER — OXYCODONE-ACETAMINOPHEN 5-325 MG PO TABS: 1.0000 | ORAL_TABLET | ORAL | Status: DC | PRN

## 2012-09-18 MED ORDER — OXYCODONE-ACETAMINOPHEN 5-325 MG PO TABS: 1.0000 | ORAL_TABLET | ORAL | Status: AC | PRN

## 2012-09-18 NOTE — Discharge Summary (Signed)
Physician Discharge Summary  Patient ID: Jason Morrison MRN: [id number] DOB/AGE: 1966/10/20 46 y.o.  Admit date: 09/17/2012 Discharge date: 09/18/2012  Admission Diagnoses:  Cervical disc herniation, cervical degenerative disc disease, cervical spondylosis, cervical radiculopathy  Discharge Diagnoses:   Cervical disc herniation, cervical degenerative disc disease, cervical spondylosis, cervical radiculopathy  Discharged Condition: good  Hospital Course: Patient was admitted underwent a 2 level C5-6 and C7-T1 ACDF. Postoperatively he has done well. He notes significant improvement in his right cervical radiculopathy, particularly improved sensation in the right hand, and relief of pain. He is up and ambulating. He is comfortable. His wound is healing nicely. There is some mild swelling beneath the incision, but he denies any dysphagia or dyspnea. He is asking to be discharged to home. He has been given instructions regarding wound care and activities. He is to return for followup with me in 3 weeks.  Discharge Exam: Blood pressure 127/78, pulse 78, temperature 98.5 F (36.9 C), temperature source Oral, resp. rate 16, SpO2 94.00%.  Disposition: Home     Medication List     As of 09/18/2012  8:06 AM    TAKE these medications         HYDROcodone-acetaminophen 5-325 MG per tablet   Commonly known as: NORCO/VICODIN   Take 1 tablet by mouth 3 (three) times daily.      oxyCODONE-acetaminophen 5-325 MG per tablet   Commonly known as: PERCOCET/ROXICET   Take 1-2 tablets by mouth every 4 (four) hours as needed for pain.      traMADol 50 MG tablet   Commonly known as: ULTRAM   Take 50 mg by mouth 4 (four) times daily.      ZANTAC PO   Take 1 tablet by mouth daily after breakfast. For acid indigestion         Signed: Hewitt Shorts, MD 09/18/2012, 8:06 AM

## 2012-09-18 NOTE — Anesthesia Postprocedure Evaluation (Signed)
  Anesthesia Post-op Note  Patient: Jason Morrison  Procedure(s) Performed: Procedure(s) (LRB) with comments: ANTERIOR CERVICAL DECOMPRESSION/DISCECTOMY FUSION 2 LEVELS (N/A) - Cervical five -six Cervical seven thoracic one anterior cervical decompression with fusion plating and bonegraft  Patient Location: PACU  Anesthesia Type:General  Level of Consciousness: awake, alert , oriented and patient cooperative  Airway and Oxygen Therapy: Patient Spontanous Breathing and Patient connected to nasal cannula oxygen  Post-op Pain: mild  Post-op Assessment: Post-op Vital signs reviewed, Patient's Cardiovascular Status Stable, Respiratory Function Stable, Patent Airway, No signs of Nausea or vomiting and Pain level controlled  Post-op Vital Signs: Reviewed and stable  Complications: No apparent anesthesia complications

## 2012-09-18 NOTE — Addendum Note (Signed)
Addendum  created 09/18/12 1610 by Germaine Pomfret, MD   Modules edited:Notes Section

## 2012-09-18 NOTE — Addendum Note (Signed)
Addendum  created 09/18/12 1216 by Sharlet Salina, CRNA   Modules edited:Charges VN

## 2012-09-20 ENCOUNTER — Encounter (HOSPITAL_COMMUNITY): Payer: Self-pay | Admitting: Neurosurgery

## 2013-02-01 ENCOUNTER — Other Ambulatory Visit: Payer: Self-pay | Admitting: Neurosurgery

## 2013-02-01 DIAGNOSIS — M5020 Other cervical disc displacement, unspecified cervical region: Secondary | ICD-10-CM

## 2013-02-01 DIAGNOSIS — M5030 Other cervical disc degeneration, unspecified cervical region: Secondary | ICD-10-CM

## 2013-02-01 DIAGNOSIS — M47812 Spondylosis without myelopathy or radiculopathy, cervical region: Secondary | ICD-10-CM

## 2013-02-04 ENCOUNTER — Ambulatory Visit
Admission: RE | Admit: 2013-02-04 | Discharge: 2013-02-04 | Disposition: A | Discharge status: Home / Self Care | Payer: 59 | Source: Ambulatory Visit | Attending: Neurosurgery | Admitting: Neurosurgery

## 2013-02-04 DIAGNOSIS — M5020 Other cervical disc displacement, unspecified cervical region: Secondary | ICD-10-CM

## 2013-02-04 DIAGNOSIS — M5030 Other cervical disc degeneration, unspecified cervical region: Secondary | ICD-10-CM

## 2013-02-04 DIAGNOSIS — M47812 Spondylosis without myelopathy or radiculopathy, cervical region: Secondary | ICD-10-CM

## 2013-08-09 ENCOUNTER — Other Ambulatory Visit: Payer: Self-pay | Admitting: Neurosurgery

## 2013-08-09 DIAGNOSIS — M542 Cervicalgia: Secondary | ICD-10-CM

## 2013-08-12 ENCOUNTER — Ambulatory Visit
Admission: RE | Admit: 2013-08-12 | Discharge: 2013-08-12 | Disposition: A | Discharge status: Home / Self Care | Payer: 59 | Source: Ambulatory Visit | Attending: Neurosurgery | Admitting: Neurosurgery

## 2013-08-12 DIAGNOSIS — M542 Cervicalgia: Secondary | ICD-10-CM

## 2013-08-19 ENCOUNTER — Other Ambulatory Visit: Payer: Self-pay | Admitting: Neurosurgery

## 2013-09-06 ENCOUNTER — Other Ambulatory Visit (HOSPITAL_COMMUNITY): Payer: 59

## 2013-09-06 ENCOUNTER — Encounter (HOSPITAL_COMMUNITY): Payer: Self-pay

## 2013-09-07 NOTE — Pre-Procedure Instructions (Signed)
Jason Morrison  09/07/2013   Your procedure is scheduled on:  October 27  Report to [facility] Entrance "A" [address] at 08:45 AM.  Call this number if you have problems the morning of surgery: [phone]   Remember:   Do not eat food or drink liquids after midnight.   Take these medicines the morning of surgery with A SIP OF WATER: Oxycodone (if needed)   Do not take Aspirin, Aleve, Naproxen, Advil, Ibuprofen, Vitamin, Herbs, or Supplements starting today  Do not wear jewelry, make-up or nail polish.  Do not wear lotions, powders, or perfumes. You may wear deodorant.  Do not shave 48 hours prior to surgery. Men may shave face and neck.  Do not bring valuables to the hospital.  The [facility] is not responsible                  for any belongings or valuables.               Contacts, dentures or bridgework may not be worn into surgery.  Leave suitcase in the car. After surgery it may be brought to your room.  For patients admitted to the hospital, discharge time is determined by your                treatment team.              Special Instructions: Shower using CHG 2 nights before surgery and the night before surgery.  If you shower the day of surgery use CHG.  Use special wash - you have one bottle of CHG for all showers.  You should use approximately 1/3 of the bottle for each shower.   Please read over the following fact sheets that you were given: Pain Booklet, Coughing and Deep Breathing, Blood Transfusion Information and Surgical Site Infection Prevention

## 2013-09-09 ENCOUNTER — Encounter (HOSPITAL_COMMUNITY): Payer: Self-pay

## 2013-09-09 ENCOUNTER — Encounter (HOSPITAL_COMMUNITY)
Admission: RE | Admit: 2013-09-09 | Discharge: 2013-09-09 | Disposition: A | Discharge status: Home / Self Care | Payer: 59 | Source: Ambulatory Visit | Attending: Neurosurgery | Admitting: Neurosurgery

## 2013-09-09 DIAGNOSIS — Z01812 Encounter for preprocedural laboratory examination: Secondary | ICD-10-CM | POA: Insufficient documentation

## 2013-09-09 LAB — BASIC METABOLIC PANEL
GFR calc Af Amer: >90 mL/min (ref >90)
GFR calc non Af Amer: >90 mL/min (ref >90)
Potassium: 4.3 mEq/L (ref 3.5–5.1)
Sodium: 137 mEq/L (ref 135–145)

## 2013-09-09 LAB — CBC
Hemoglobin: 15.3 g/dL (ref 13.0–17.0)
MCHC: 33.8 g/dL (ref 30.0–36.0)
RBC: 5.01 MIL/uL (ref 4.22–5.81)

## 2013-09-09 LAB — SURGICAL PCR SCREEN
MRSA, PCR: NEGATIVE
Staphylococcus aureus: NEGATIVE

## 2013-09-09 LAB — TYPE AND SCREEN
ABO/RH(D): A POS
Antibody Screen: NEGATIVE

## 2013-09-09 LAB — ABO/RH: ABO/RH(D): A POS

## 2013-09-09 NOTE — Progress Notes (Signed)
09/09/13 0818  OBSTRUCTIVE SLEEP APNEA  Have you ever been diagnosed with sleep apnea through a sleep study? No  Do you snore loudly (loud enough to be heard through closed doors)?  1  Do you often feel tired, fatigued, or sleepy during the daytime? 1  Has anyone observed you stop breathing during your sleep? 1  Do you have, or are you being treated for high blood pressure? 0  BMI more than 35 kg/m2? 0  Age over 49 years old? 0  Neck circumference greater than 40 cm/18 inches? 0  Gender: 1  Obstructive Sleep Apnea Score 4  Score 4 or greater  Results sent to PCP

## 2013-09-15 MED ORDER — CEFAZOLIN SODIUM-DEXTROSE 2-3 GM-% IV SOLR
2.0000 g | INTRAVENOUS | Status: AC
  Administered 2013-09-16: 2 g via INTRAVENOUS
  Filled 2013-09-15: qty 50

## 2013-09-15 NOTE — Anesthesia Preprocedure Evaluation (Signed)
Anesthesia Evaluation  Patient identified by MRN, date of birth, ID band Patient awake    Reviewed: Allergy & Precautions, H&P , NPO status , Patient's Chart, lab work & pertinent test results  History of Anesthesia Complications Negative for: history of anesthetic complications  Airway Mallampati: II TM Distance: >3 FB Neck ROM: Limited    Dental  (+) Teeth Intact, Dental Advisory Given, Implants and Caps   Pulmonary neg pulmonary ROS, COPDCurrent Smoker,  + rhonchi   Pulmonary exam normal       Cardiovascular Exercise Tolerance: Good negative cardio ROS  Rhythm:Regular Rate:Normal     Neuro/Psych R neck pain and numbness from R elbow down to hand Narcotic dependent negative psych ROS   GI/Hepatic Neg liver ROS, GERD-  Poorly Controlled and Medicated,  Endo/Other  negative endocrine ROS  Renal/GU negative Renal ROS     Musculoskeletal negative musculoskeletal ROS (+)   Abdominal (+) + obese,   Peds  Hematology negative hematology ROS (+)   Anesthesia Other Findings   Reproductive/Obstetrics                           Anesthesia Physical Anesthesia Plan  ASA: II  Anesthesia Plan: General   Post-op Pain Management:    Induction: Intravenous  Airway Management Planned: Oral ETT and Video Laryngoscope Planned  Additional Equipment:   Intra-op Plan:   Post-operative Plan: Extubation in OR  Informed Consent: I have reviewed the patients History and Physical, chart, labs and discussed the procedure including the risks, benefits and alternatives for the proposed anesthesia with the patient or authorized representative who has indicated his/her understanding and acceptance.     Plan Discussed with: CRNA and Surgeon  Anesthesia Plan Comments:         Anesthesia Quick Evaluation

## 2013-09-16 ENCOUNTER — Encounter (HOSPITAL_COMMUNITY): Payer: Self-pay | Admitting: *Deleted

## 2013-09-16 ENCOUNTER — Ambulatory Visit (HOSPITAL_COMMUNITY): Payer: 59 | Admitting: Anesthesiology

## 2013-09-16 ENCOUNTER — Encounter (HOSPITAL_COMMUNITY)
Admission: RE | Disposition: A | Discharge status: Home / Self Care | Payer: Self-pay | Source: Ambulatory Visit | Attending: Neurosurgery

## 2013-09-16 ENCOUNTER — Encounter (HOSPITAL_COMMUNITY): Payer: 59 | Admitting: Anesthesiology

## 2013-09-16 ENCOUNTER — Ambulatory Visit (HOSPITAL_COMMUNITY)
Admission: RE | Admit: 2013-09-16 | Discharge: 2013-09-17 | Disposition: A | Discharge status: Home / Self Care | Payer: 59 | Source: Ambulatory Visit | Attending: Neurosurgery | Admitting: Neurosurgery

## 2013-09-16 ENCOUNTER — Ambulatory Visit (HOSPITAL_COMMUNITY): Payer: 59

## 2013-09-16 DIAGNOSIS — T84498A Other mechanical complication of other internal orthopedic devices, implants and grafts, initial encounter: Secondary | ICD-10-CM | POA: Insufficient documentation

## 2013-09-16 DIAGNOSIS — K219 Gastro-esophageal reflux disease without esophagitis: Secondary | ICD-10-CM | POA: Insufficient documentation

## 2013-09-16 DIAGNOSIS — Z981 Arthrodesis status: Secondary | ICD-10-CM | POA: Insufficient documentation

## 2013-09-16 DIAGNOSIS — J4489 Other specified chronic obstructive pulmonary disease: Secondary | ICD-10-CM | POA: Insufficient documentation

## 2013-09-16 DIAGNOSIS — J449 Chronic obstructive pulmonary disease, unspecified: Secondary | ICD-10-CM | POA: Insufficient documentation

## 2013-09-16 DIAGNOSIS — Z79899 Other long term (current) drug therapy: Secondary | ICD-10-CM | POA: Insufficient documentation

## 2013-09-16 DIAGNOSIS — Y832 Surgical operation with anastomosis, bypass or graft as the cause of abnormal reaction of the patient, or of later complication, without mention of misadventure at the time of the procedure: Secondary | ICD-10-CM | POA: Insufficient documentation

## 2013-09-16 HISTORY — PX: POSTERIOR CERVICAL FUSION/FORAMINOTOMY: SHX5038

## 2013-09-16 SURGERY — POSTERIOR CERVICAL FUSION/FORAMINOTOMY LEVEL 3
Anesthesia: General | Site: Neck | Wound class: Clean

## 2013-09-16 MED ORDER — HYDROXYZINE HCL 25 MG PO TABS: 50.0000 mg | ORAL_TABLET | ORAL | Status: DC | PRN

## 2013-09-16 MED ORDER — ROCURONIUM BROMIDE 100 MG/10ML IV SOLN
INTRAVENOUS | Status: DC | PRN
  Administered 2013-09-16: 50 mg via INTRAVENOUS
  Administered 2013-09-16: 20 mg via INTRAVENOUS
  Administered 2013-09-16: 30 mg via INTRAVENOUS

## 2013-09-16 MED ORDER — PROPOFOL 10 MG/ML IV BOLUS
INTRAVENOUS | Status: DC | PRN
  Administered 2013-09-16: 200 mg via INTRAVENOUS

## 2013-09-16 MED ORDER — LACTATED RINGERS IV SOLN
INTRAVENOUS | Status: DC | PRN
  Administered 2013-09-16 (×3): via INTRAVENOUS

## 2013-09-16 MED ORDER — ARTIFICIAL TEARS OP OINT
TOPICAL_OINTMENT | OPHTHALMIC | Status: DC | PRN
  Administered 2013-09-16: 1 via OPHTHALMIC

## 2013-09-16 MED ORDER — SODIUM CHLORIDE 0.9 % IJ SOLN
3.0000 mL | Freq: Two times a day (BID) | INTRAMUSCULAR | Status: DC
  Administered 2013-09-16: 3 mL via INTRAVENOUS

## 2013-09-16 MED ORDER — MAGNESIUM HYDROXIDE 400 MG/5ML PO SUSP: 30.0000 mL | Freq: Every day | ORAL | Status: DC | PRN

## 2013-09-16 MED ORDER — BACITRACIN ZINC 500 UNIT/GM EX OINT
TOPICAL_OINTMENT | CUTANEOUS | Status: DC | PRN
  Administered 2013-09-16: 1 via TOPICAL

## 2013-09-16 MED ORDER — THROMBIN 20000 UNITS EX SOLR
CUTANEOUS | Status: DC | PRN
  Administered 2013-09-16: 13:00:00 via TOPICAL

## 2013-09-16 MED ORDER — LIDOCAINE HCL (CARDIAC) 20 MG/ML IV SOLN
INTRAVENOUS | Status: DC | PRN
  Administered 2013-09-16: 100 mg via INTRAVENOUS

## 2013-09-16 MED ORDER — NEOSTIGMINE METHYLSULFATE 1 MG/ML IJ SOLN
INTRAMUSCULAR | Status: DC | PRN
  Administered 2013-09-16: 4 mg via INTRAVENOUS

## 2013-09-16 MED ORDER — ACETAMINOPHEN 10 MG/ML IV SOLN
INTRAVENOUS | Status: AC
  Administered 2013-09-16: 1000 mg via INTRAVENOUS
  Filled 2013-09-16: qty 100

## 2013-09-16 MED ORDER — HYDROMORPHONE HCL PF 1 MG/ML IJ SOLN
INTRAMUSCULAR | Status: AC
  Filled 2013-09-16: qty 1

## 2013-09-16 MED ORDER — OXYCODONE HCL 5 MG/5ML PO SOLN: 5.0000 mg | Freq: Once | ORAL | Status: AC | PRN

## 2013-09-16 MED ORDER — HYDROCODONE-ACETAMINOPHEN 5-325 MG PO TABS: 1.0000 | ORAL_TABLET | ORAL | Status: DC | PRN

## 2013-09-16 MED ORDER — OXYCODONE-ACETAMINOPHEN 5-325 MG PO TABS
1.0000 | ORAL_TABLET | ORAL | Status: DC | PRN
  Administered 2013-09-16 – 2013-09-17 (×4): 2 via ORAL
  Filled 2013-09-16 (×4): qty 2

## 2013-09-16 MED ORDER — ZOLPIDEM TARTRATE 5 MG PO TABS: 10.0000 mg | ORAL_TABLET | Freq: Every evening | ORAL | Status: DC | PRN

## 2013-09-16 MED ORDER — ONDANSETRON HCL 4 MG/2ML IJ SOLN
INTRAMUSCULAR | Status: DC | PRN
  Administered 2013-09-16: 4 mg via INTRAVENOUS

## 2013-09-16 MED ORDER — SODIUM CHLORIDE 0.9 % IR SOLN
Status: DC | PRN
  Administered 2013-09-16: 13:00:00

## 2013-09-16 MED ORDER — LACTATED RINGERS IV SOLN
INTRAVENOUS | Status: DC
  Administered 2013-09-16: 10:00:00 via INTRAVENOUS

## 2013-09-16 MED ORDER — ACETAMINOPHEN 325 MG PO TABS: 650.0000 mg | ORAL_TABLET | ORAL | Status: DC | PRN

## 2013-09-16 MED ORDER — CYCLOBENZAPRINE HCL 10 MG PO TABS
10.0000 mg | ORAL_TABLET | Freq: Three times a day (TID) | ORAL | Status: DC | PRN
  Administered 2013-09-16 (×2): 10 mg via ORAL
  Filled 2013-09-16: qty 1

## 2013-09-16 MED ORDER — OXYCODONE HCL 5 MG PO TABS
ORAL_TABLET | ORAL | Status: AC
  Filled 2013-09-16: qty 1

## 2013-09-16 MED ORDER — FENTANYL CITRATE 0.05 MG/ML IJ SOLN: 50.0000 ug | Freq: Once | INTRAMUSCULAR | Status: DC

## 2013-09-16 MED ORDER — MENTHOL 3 MG MT LOZG: 1.0000 | LOZENGE | OROMUCOSAL | Status: DC | PRN

## 2013-09-16 MED ORDER — ACETAMINOPHEN 650 MG RE SUPP: 650.0000 mg | RECTAL | Status: DC | PRN

## 2013-09-16 MED ORDER — BUPIVACAINE HCL (PF) 0.5 % IJ SOLN
INTRAMUSCULAR | Status: DC | PRN
  Administered 2013-09-16: 9.5 mL

## 2013-09-16 MED ORDER — GENTAMICIN IN SALINE 1.6-0.9 MG/ML-% IV SOLN
80.0000 mg | Freq: Once | INTRAVENOUS | Status: DC
  Filled 2013-09-16: qty 50

## 2013-09-16 MED ORDER — MIDAZOLAM HCL 5 MG/5ML IJ SOLN
INTRAMUSCULAR | Status: DC | PRN
  Administered 2013-09-16: 2 mg via INTRAVENOUS

## 2013-09-16 MED ORDER — 0.9 % SODIUM CHLORIDE (POUR BTL) OPTIME
TOPICAL | Status: DC | PRN
  Administered 2013-09-16: 1000 mL

## 2013-09-16 MED ORDER — PROMETHAZINE HCL 25 MG/ML IJ SOLN: 6.2500 mg | INTRAMUSCULAR | Status: DC | PRN

## 2013-09-16 MED ORDER — OXYCODONE HCL 5 MG PO TABS
5.0000 mg | ORAL_TABLET | Freq: Once | ORAL | Status: AC | PRN
Start: 2013-09-16 — End: 2013-09-16
  Administered 2013-09-16: 5 mg via ORAL

## 2013-09-16 MED ORDER — ALBUMIN HUMAN 5 % IV SOLN
INTRAVENOUS | Status: DC | PRN
  Administered 2013-09-16: 14:00:00 via INTRAVENOUS

## 2013-09-16 MED ORDER — BISACODYL 10 MG RE SUPP: 10.0000 mg | Freq: Every day | RECTAL | Status: DC | PRN

## 2013-09-16 MED ORDER — FENTANYL CITRATE 0.05 MG/ML IJ SOLN
INTRAMUSCULAR | Status: DC | PRN
  Administered 2013-09-16: 100 ug via INTRAVENOUS
  Administered 2013-09-16: 50 ug via INTRAVENOUS
  Administered 2013-09-16: 100 ug via INTRAVENOUS
  Administered 2013-09-16: 50 ug via INTRAVENOUS
  Administered 2013-09-16: 100 ug via INTRAVENOUS
  Administered 2013-09-16 (×3): 50 ug via INTRAVENOUS

## 2013-09-16 MED ORDER — GLYCOPYRROLATE 0.2 MG/ML IJ SOLN
INTRAMUSCULAR | Status: DC | PRN
  Administered 2013-09-16: .8 mg via INTRAVENOUS

## 2013-09-16 MED ORDER — MORPHINE SULFATE 4 MG/ML IJ SOLN
4.0000 mg | INTRAMUSCULAR | Status: DC | PRN
  Administered 2013-09-16: 4 mg via INTRAMUSCULAR
  Filled 2013-09-16: qty 1

## 2013-09-16 MED ORDER — ALUM & MAG HYDROXIDE-SIMETH 200-200-20 MG/5ML PO SUSP: 30.0000 mL | Freq: Four times a day (QID) | ORAL | Status: DC | PRN

## 2013-09-16 MED ORDER — HYDROMORPHONE HCL PF 1 MG/ML IJ SOLN
0.2500 mg | INTRAMUSCULAR | Status: DC | PRN
  Administered 2013-09-16 (×4): 0.5 mg via INTRAVENOUS

## 2013-09-16 MED ORDER — KCL IN DEXTROSE-NACL 20-5-0.45 MEQ/L-%-% IV SOLN
INTRAVENOUS | Status: DC
  Filled 2013-09-16 (×3): qty 1000

## 2013-09-16 MED ORDER — SODIUM CHLORIDE 0.9 % IJ SOLN: 3.0000 mL | INTRAMUSCULAR | Status: DC | PRN

## 2013-09-16 MED ORDER — ONDANSETRON HCL 4 MG/2ML IJ SOLN: 4.0000 mg | Freq: Four times a day (QID) | INTRAMUSCULAR | Status: DC | PRN

## 2013-09-16 MED ORDER — CYCLOBENZAPRINE HCL 10 MG PO TABS
ORAL_TABLET | ORAL | Status: AC
  Filled 2013-09-16: qty 1

## 2013-09-16 MED ORDER — PHENOL 1.4 % MT LIQD: 1.0000 | OROMUCOSAL | Status: DC | PRN

## 2013-09-16 MED ORDER — MIDAZOLAM HCL 2 MG/2ML IJ SOLN: 1.0000 mg | INTRAMUSCULAR | Status: DC | PRN

## 2013-09-16 MED ORDER — LIDOCAINE-EPINEPHRINE 1 %-1:100000 IJ SOLN
INTRAMUSCULAR | Status: DC | PRN
  Administered 2013-09-16: 9.5 mL

## 2013-09-16 MED ORDER — PHENYLEPHRINE HCL 10 MG/ML IJ SOLN
INTRAMUSCULAR | Status: DC | PRN
  Administered 2013-09-16: 80 ug via INTRAVENOUS
  Administered 2013-09-16: 40 ug via INTRAVENOUS
  Administered 2013-09-16 (×3): 80 ug via INTRAVENOUS
  Administered 2013-09-16: 40 ug via INTRAVENOUS

## 2013-09-16 SURGICAL SUPPLY — 75 items
2.5 MM DRILL BIT ×2 IMPLANT
ADH SKN CLS APL DERMABOND .7 (GAUZE/BANDAGES/DRESSINGS)
BAG DECANTER FOR FLEXI CONT (MISCELLANEOUS) ×2 IMPLANT
BIT DRILL NEURO 2X3.1 SFT TUCH (MISCELLANEOUS) ×1 IMPLANT
BIT DRILL WIRE PASS 1.3MM (BIT) IMPLANT
BLADE SURG 11 STRL SS (BLADE) ×2 IMPLANT
BLADE SURG ROTATE 9660 (MISCELLANEOUS) ×2 IMPLANT
BLOCKER OASYS (Neuro Prosthesis/Implant) ×16 IMPLANT
BRUSH SCRUB EZ PLAIN DRY (MISCELLANEOUS) ×2 IMPLANT
CANISTER SUCT 3000ML (MISCELLANEOUS) ×2 IMPLANT
CONT SPEC 4OZ CLIKSEAL STRL BL (MISCELLANEOUS) ×2 IMPLANT
COVER TABLE BACK 60X90 (DRAPES) ×2 IMPLANT
DECANTER SPIKE VIAL GLASS SM (MISCELLANEOUS) ×2 IMPLANT
DERMABOND ADVANCED (GAUZE/BANDAGES/DRESSINGS)
DERMABOND ADVANCED .7 DNX12 (GAUZE/BANDAGES/DRESSINGS) IMPLANT
DRAPE C-ARM 42X72 X-RAY (DRAPES) ×4 IMPLANT
DRAPE C-ARMOR (DRAPES) ×2 IMPLANT
DRAPE LAPAROTOMY 100X72 PEDS (DRAPES) ×2 IMPLANT
DRAPE POUCH INSTRU U-SHP 10X18 (DRAPES) ×2 IMPLANT
DRAPE PROXIMA HALF (DRAPES) IMPLANT
DRILL NEURO 2X3.1 SOFT TOUCH (MISCELLANEOUS) ×2
DRILL OASYS 2.5MM (BIT) ×1 IMPLANT
DRILL WIRE PASS 1.3MM (BIT)
DRIUS OASYS 2.5MM (BIT) ×2
DRSG ADAPTIC 3X8 NADH LF (GAUZE/BANDAGES/DRESSINGS) ×2 IMPLANT
DRSG EMULSION OIL 3X3 NADH (GAUZE/BANDAGES/DRESSINGS) IMPLANT
ELECT REM PT RETURN 9FT ADLT (ELECTROSURGICAL) ×2
ELECTRODE REM PT RTRN 9FT ADLT (ELECTROSURGICAL) ×1 IMPLANT
EVACUATOR 1/8 PVC DRAIN (DRAIN) IMPLANT
GAUZE SPONGE 4X4 16PLY XRAY LF (GAUZE/BANDAGES/DRESSINGS) IMPLANT
GLOVE BIOGEL PI IND STRL 7.5 (GLOVE) ×1 IMPLANT
GLOVE BIOGEL PI IND STRL 8 (GLOVE) ×2 IMPLANT
GLOVE BIOGEL PI INDICATOR 7.5 (GLOVE) ×1
GLOVE BIOGEL PI INDICATOR 8 (GLOVE) ×2
GLOVE ECLIPSE 7.5 STRL STRAW (GLOVE) ×12 IMPLANT
GLOVE ECLIPSE 8.5 STRL (GLOVE) ×2 IMPLANT
GLOVE EXAM NITRILE LRG STRL (GLOVE) IMPLANT
GLOVE EXAM NITRILE MD LF STRL (GLOVE) IMPLANT
GLOVE EXAM NITRILE XL STR (GLOVE) IMPLANT
GLOVE EXAM NITRILE XS STR PU (GLOVE) IMPLANT
GLOVE SURG SS PI 7.5 STRL IVOR (GLOVE) ×2 IMPLANT
GOWN BRE IMP SLV AUR LG STRL (GOWN DISPOSABLE) IMPLANT
GOWN BRE IMP SLV AUR XL STRL (GOWN DISPOSABLE) ×6 IMPLANT
GOWN STRL REIN 2XL LVL4 (GOWN DISPOSABLE) ×2 IMPLANT
HEMOSTAT SURGICEL 2X14 (HEMOSTASIS) IMPLANT
KIT BASIN OR (CUSTOM PROCEDURE TRAY) ×2 IMPLANT
KIT INFUSE X SMALL 1.4CC (Orthopedic Implant) ×2 IMPLANT
KIT ROOM TURNOVER OR (KITS) ×2 IMPLANT
NEEDLE SPNL 18GX3.5 QUINCKE PK (NEEDLE) ×2 IMPLANT
NEEDLE SPNL 22GX3.5 QUINCKE BK (NEEDLE) ×4 IMPLANT
NS IRRIG 1000ML POUR BTL (IV SOLUTION) ×2 IMPLANT
PACK LAMINECTOMY NEURO (CUSTOM PROCEDURE TRAY) ×2 IMPLANT
PAD ARMBOARD 7.5X6 YLW CONV (MISCELLANEOUS) ×6 IMPLANT
PIN MAYFIELD SKULL DISP (PIN) ×2 IMPLANT
ROD OASYS 3.5X240MM (Rod) ×2 IMPLANT
SCREW BIASED ANGLE 3.5X14 (Screw) ×12 IMPLANT
SCREW BIASED ANGLE 3.5X18 (Screw) ×4 IMPLANT
SPONGE GAUZE 4X4 12PLY (GAUZE/BANDAGES/DRESSINGS) ×2 IMPLANT
SPONGE LAP 4X18 X RAY DECT (DISPOSABLE) IMPLANT
SPONGE SURGIFOAM ABS GEL 100 (HEMOSTASIS) ×2 IMPLANT
STAPLER SKIN PROX WIDE 3.9 (STAPLE) ×2 IMPLANT
STRIP BIOACTIVE VITOSS 25X100X (Neuro Prosthesis/Implant) ×2 IMPLANT
SUT ETHILON 3 0 FSL (SUTURE) IMPLANT
SUT VIC AB 0 CT1 18XCR BRD8 (SUTURE) ×2 IMPLANT
SUT VIC AB 0 CT1 8-18 (SUTURE) ×2
SUT VIC AB 2-0 CP2 18 (SUTURE) ×4 IMPLANT
SYR 20ML ECCENTRIC (SYRINGE) ×2 IMPLANT
TAP 3.5MM (TAP) ×2 IMPLANT
TAPE CLOTH SURG 4X10 WHT LF (GAUZE/BANDAGES/DRESSINGS) ×2 IMPLANT
TOWEL OR 17X24 6PK STRL BLUE (TOWEL DISPOSABLE) ×2 IMPLANT
TOWEL OR 17X26 10 PK STRL BLUE (TOWEL DISPOSABLE) ×2 IMPLANT
TRAY FOLEY CATH 14FRSI W/METER (CATHETERS) IMPLANT
TRAY FOLEY METER SIL LF 16FR (CATHETERS) ×2 IMPLANT
UNDERPAD 30X30 INCONTINENT (UNDERPADS AND DIAPERS) ×2 IMPLANT
WATER STERILE IRR 1000ML POUR (IV SOLUTION) ×2 IMPLANT

## 2013-09-16 NOTE — Anesthesia Procedure Notes (Signed)
Procedure Name: Intubation Date/Time: 09/16/2013 11:59 AM Performed by: Hewitt Shorts Pre-anesthesia Checklist: Patient identified, Emergency Drugs available, Suction available, Patient being monitored and Timeout performed Patient Re-evaluated:Patient Re-evaluated prior to inductionOxygen Delivery Method: Circle system utilized Preoxygenation: Pre-oxygenation with 100% oxygen Intubation Type: IV induction Ventilation: Mask ventilation without difficulty Grade View: Grade III Tube type: Oral Tube size: 7.5 mm Number of attempts: 1 Airway Equipment and Method: Stylet and Video-laryngoscopy Placement Confirmation: ETT inserted through vocal cords under direct vision,  positive ETCO2 and breath sounds checked- equal and bilateral Secured at: 22 cm Tube secured with: Tape Dental Injury: Teeth and Oropharynx as per pre-operative assessment  Difficulty Due To: Difficulty was anticipated and Difficult Airway- due to reduced neck mobility Comments: Easy intubation with glidescope

## 2013-09-16 NOTE — Transfer of Care (Signed)
Immediate Anesthesia Transfer of Care Note  Patient: Jason Morrison  Procedure(s) Performed: Procedure(s) with comments: Cervical five-six, Cervical six-seven, Cervical seven-Thoracic one posterior arthrodesis with posterior instrumentation and bonegraft (N/A) - Cervical five-six, Cervical six-seven, Cervical seven-Thoracic one posterior arthrodesis with posterior instrumentation and bonegraft  Patient Location: PACU  Anesthesia Type:General  Level of Consciousness: awake and alert   Airway & Oxygen Therapy: Patient Spontanous Breathing and Patient connected to nasal cannula oxygen  Post-op Assessment: Report given to PACU RN and Post -op Vital signs reviewed and stable  Post vital signs: Reviewed and stable  Complications: No apparent anesthesia complications

## 2013-09-16 NOTE — Op Note (Signed)
09/16/2013  3:00 PM  PATIENT:  Jason Morrison  47 y.o. male  PRE-OPERATIVE DIAGNOSIS:  pseudoarthrosis of cervical spine cervical radiuclopathy neck pain  POST-OPERATIVE DIAGNOSIS:  pseudoarthrosis of cervical spine cervical radiuclopathy neck pain  PROCEDURE:  Procedure(s): Cervical five-six, Cervical six-seven, Cervical seven-Thoracic one posterior arthrodesis with posterior instrumentation and bonegraft:  C5-T1 posterior cervical thoracic arthrodesis with C5, C6, and C7 lateral mass screws and T1 pedicle screws and rods, with C-arm fluoroscopic guidance, and Vitoss BA and infuse  SURGEON:  Surgeon(s): Hewitt Shorts, MD Temple Pacini, MD  ASSISTANTS: Temple Pacini, M.D.  ANESTHESIA:   general  EBL:  Total I/O In: 2050 [I.V.:1800; IV Piggyback:250] Out: 200 [Urine:175; Blood:25]  BLOOD ADMINISTERED:none  COUNT: Correct per nursing staff  DICTATION: Patient was brought to the operating room, placed under general endotracheal anesthesia. Patient was placed in a radiolucent 3 pin Mayfield head holder, and turned to prone position.  The occipital scalp was shaved with clippers third and then the posterior neck and upper back were prepped with Betadine soap and solution draped in a sterile fashion. The C5-T1 level was localized with C-arm fluoroscopy, the midline was infiltrated with local anesthetic with epinephrine, and a midline incision was made from C5-T1. Dissection was carried down through the subcutaneous tissue. Bipolar cautery and electrocautery were used to maintain hemostasis.  The posterior cervical thoracic fascia was incised bilaterally, and the paracervical and parathoracic musculature was dissected from the spinous process and lamina in a subperiosteal fashion. Subcutaneous retractors were placed. We identified the lateral masses of C5, C6, and C7 bilaterally. Using C-arm fluoroscopic guidance entry points were identified.  Pilot holes were started with the high-speed  drill, and then each screw hole was then drilled in a superior, lateral, and anterior trajectory.  Each was examined with the ball probe and with C-arm fluoroscopic guidance. Once good positioning and good bony surfaces were confirmed, the posterior cortex was tapped, and we placed 3.5 x 14 mm screws bilaterally at C5, C6, and C7. We then identified entry points for bilateral T1 pedicle screws using C-arm fluoroscopic. A pilot hole was made bilaterally. An again using C-arm fluoroscopic guidance were able to use a hand drill to drill down through the T1 pedicle, into the T1 vertebral body. The screw hole was examined with the ball probe, good bony surfaces were found, and then the posterior cortex was tapped, and a 3.5 x 18 mm screw was placed in the T1 pedicles bilaterally. We then cut a rod into 4 segments, placing individual segments bilaterally at the C5-6 and C7-T1 levels. Once the replace, they were secured with locking caps. Once all the locking caps were placed final tightening was performed against a counter torque. We then decorticated the laminar surfaces of C5, C6, C7, and T1 as well as the facet joint surfaces at C5-6 bilateral. We packed a combination of infuse and Vitoss BA into the C5-6 facet joints and over the laminar surfaces from C5-T1. We'll pursue with closure. Deep fascia closed with interrupted undyed 0 Vicryl sutures, Scarpa's fascia closed with interrupted undyed 0 Vicryl sutures, subcutaneous subcuticular closed with interrupted inverted 2-0 Vicryl sutures, scheduled for surgical staples. We'll was dressed with Adaptic sterile gauze and Hypafix. Following surgery the patient was turned back in supine position, the 3 pin Mayfield head holder was removed, he is to be reversed and the anesthetic, extubated, and transferred to the recovery room for further care.  PLAN OF CARE: Admit for overnight observation  PATIENT DISPOSITION:  PACU - hemodynamically stable.   Delay start of  Pharmacological VTE agent (>24hrs) due to surgical blood loss or risk of bleeding:  yes

## 2013-09-16 NOTE — Anesthesia Postprocedure Evaluation (Signed)
  Anesthesia Post-op Note  Patient: Jason Morrison  Procedure(s) Performed: Procedure(s) with comments: Cervical five-six, Cervical six-seven, Cervical seven-Thoracic one posterior arthrodesis with posterior instrumentation and bonegraft (N/A) - Cervical five-six, Cervical six-seven, Cervical seven-Thoracic one posterior arthrodesis with posterior instrumentation and bonegraft  Patient Location: PACU  Anesthesia Type:General  Level of Consciousness: awake and alert   Airway and Oxygen Therapy: Patient Spontanous Breathing  Post-op Pain: mild  Post-op Assessment: Post-op Vital signs reviewed, Patient's Cardiovascular Status Stable, Respiratory Function Stable, Patent Airway, No signs of Nausea or vomiting and Pain level controlled  Post-op Vital Signs: Reviewed and stable  Complications: No apparent anesthesia complications

## 2013-09-16 NOTE — Progress Notes (Signed)
Filed Vitals:   09/16/13 1630 09/16/13 1652 09/16/13 1706 09/16/13 2000  BP:  129/82 174/98 164/98  Pulse: 88 87 97 107  Temp:  97.9 F (36.6 C) 97.8 F (36.6 C) 98.5 F (36.9 C)  TempSrc:    Oral  Resp: 13 10 16 20   SpO2: 100% 100% 97% 96%    Patient resting in bed. Has ambulate in the halls. Voided. Dressing clean and dry.  Plan: Encouraged to continue to ambulate in halls.  Hewitt Shorts, MD 09/16/2013, 8:12 PM

## 2013-09-16 NOTE — Preoperative (Signed)
Beta Blockers   Reason not to administer Beta Blockers:Not Applicable 

## 2013-09-16 NOTE — Plan of Care (Signed)
Problem: Consults Goal: Diagnosis - Spinal Surgery Outcome: Completed/Met Date Met:  09/16/13 Cervical Spine Fusion     

## 2013-09-16 NOTE — H&P (Signed)
Subjective: Patient is a 47 y.o. male who is admitted for treatment of persistent posterior neck pain, extends to the right side of the neck, into the right shoulder, as well as the medial aspect of the right forearm, with numbness and tingling in the medial aspect of the right hand, into the fourth and fifth digits. There is mild weakness of the intrinsics and grip in the right hand. Patient has one year status post a C5-6 and C7-T1 ACDF, and status post a previous C6-7 ACDF. CT showed no progression of fusion at the C5-6 level and a minimal progression of fusion at the C7-T1 level, the fusion at C6-7 is well-healed.  Patient is admitted now for a C5-T1 posterior cervical arthrodesis with lateral mass and pedicle screws, rods, and bone graft. MRI does not reveal neural compression and therefore there is no indication for decompression.    Past Medical History  Diagnosis Date  . No pertinent past medical history     Past Surgical History  Procedure Laterality Date  . Neck fusion    . Anterior cervical decomp/discectomy fusion  09/17/2012    Procedure: ANTERIOR CERVICAL DECOMPRESSION/DISCECTOMY FUSION 2 LEVELS;  Surgeon: Hewitt Shorts, MD;  Location: [facility];  Service: Neurosurgery;  Laterality: N/A;  Cervical five -six Cervical seven thoracic one anterior cervical decompression with fusion plating and bonegraft  . Back surgery      x 2 lumbar  surgeries    No prescriptions prior to admission   Allergies  Allergen Reactions  . Chantix [Varenicline]     Mood swings  . Ibuprofen Hives  . Sulfa Antibiotics Hives    History  Substance Use Topics  . Smoking status: Current Every Day Smoker -- 0.50 packs/day for 18 years  . Smokeless tobacco: Not on file  . Alcohol Use: No    No family history on file.   Review of Systems A comprehensive review of systems was negative.  Objective: Vital signs in last 24 hours:    EXAM: Patient is a well-developed well-nourished white male in  no acute distress. Lungs are clear to auscultation , the patient has symmetrical respiratory excursion. Heart has a regular rate and rhythm normal S1 and S2 no murmur.   Abdomen is soft nontender nondistended bowel sounds are present. Extremity examination shows no clubbing cyanosis or edema. Motor examination shows 5/5 strength in the upper extremities including the deltoid biceps triceps and intrinsics and grip, except for the right intrinsics and grip which are 4/5. Sensation is intact to pinprick throughout the digits of the upper extremities. Reflexes are symmetrical and without evidence of pathologic reflexes. Patient has a normal gait and stance.   Data Review:CBC    Component Value Date/Time   WBC 11.5* 09/09/2013 0840   RBC 5.01 09/09/2013 0840   HGB 15.3 09/09/2013 0840   HCT 45.3 09/09/2013 0840   PLT 355 09/09/2013 0840   MCV 90.4 09/09/2013 0840   MCH 30.5 09/09/2013 0840   MCHC 33.8 09/09/2013 0840   RDW 12.5 09/09/2013 0840                          BMET    Component Value Date/Time   NA 137 09/09/2013 0840   K 4.3 09/09/2013 0840   CL 100 09/09/2013 0840   CO2 27 09/09/2013 0840   GLUCOSE 86 09/09/2013 0840   BUN 11 09/09/2013 0840   CREATININE 0.82 09/09/2013 0840   CALCIUM 9.3  09/09/2013 0840   GFRNONAA >90 09/09/2013 0840   GFRAA >90 09/09/2013 0840     Assessment/Plan: Patient with persistent neck and right cervical radicular pain, with numbness in the fourth and fifth digits of the right hand, and weakness of the right intrinsics and grip. 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  I've discussed with the patient the nature of his condition, the nature the surgical procedure, the typical length of surgery, hospital stay, and overall recuperation. We discussed limitations postoperatively. I discussed risks of surgery including risks of infection, bleeding, possibly need for transfusion,  the risk of nerve root dysfunction with pain, weakness, numbness, or paresthesias, the risk of spinal cord dysfunction with paralysis of all 4 limbs and quadriplegia, and the risk of dural tear and CSF leakage and possible need for further surgery, the risk of failure of the arthrodesis and the possible need for further surgery, and the risk of anesthetic complications including myocardial infarction, stroke, pneumonia, and death. We also discussed the need for postoperative immobilization in a cervical collar. Understanding all this the patient does wish to proceed with surgery and is admitted for such.    Hewitt Shorts, MD 09/16/2013 7:13 AM'

## 2013-09-17 MED ORDER — OXYCODONE-ACETAMINOPHEN 5-325 MG PO TABS: 1.0000 | ORAL_TABLET | ORAL | Status: AC | PRN

## 2013-09-17 NOTE — Progress Notes (Signed)
Pt. discharged home accompanied by spouse. Prescriptions and discharge instructions given with verbalization of understanding. Incision site on neck with no s/s of infection - no swelling, redness, bleeding, and/or drainage noted. Soft collar intact. Pain med given just before leaving. Opportunity given to ask questions but no question asked.

## 2013-09-17 NOTE — Discharge Summary (Signed)
Physician Discharge Summary  Patient ID: Jason Morrison MRN: [id number] DOB/AGE: 49-Aug-1967 74 y.o.  Admit date: 09/16/2013 Discharge date: 09/17/2013  Admission Diagnoses:  Pseudoarthrosis of the cervical spine, cervical radiculopathy, cervicalgia  Discharge Diagnoses: Pseudoarthrosis of the cervical spine, cervical radiculopathy, cervicalgia  Discharged Condition: good  Hospital Course: Patient was admitted, underwent a C5-T1 posterior cervical thoracic arthrodesis with lateral mass and pedicle screws and rods and bone graft. Postoperatively the patient had moderate incisional pain. He is up and living. The wound is healing nicely. He is asking to be discharged home. He's been given instructions regarding wound care and activities following discharge. Return in one week for staple removal.  Discharge Exam: Blood pressure 152/89, pulse 93, temperature 98.9 F (37.2 C), temperature source Oral, resp. rate 20, SpO2 96.00%.  Disposition: Home     Medication List         cyclobenzaprine 5 MG tablet  Commonly known as:  FLEXERIL  Take 5 mg by mouth at bedtime as needed for muscle spasms.     oxyCODONE-acetaminophen 5-325 MG per tablet  Commonly known as:  PERCOCET/ROXICET  Take 1-2 tablets by mouth every 4 (four) hours as needed for pain.     oxyCODONE-acetaminophen 5-325 MG per tablet  Commonly known as:  PERCOCET/ROXICET  Take 1-2 tablets by mouth every 4 (four) hours as needed for pain.         Signed: Hewitt Shorts, MD 09/17/2013, 8:19 AM

## 2013-09-20 ENCOUNTER — Encounter (HOSPITAL_COMMUNITY): Payer: Self-pay | Admitting: Neurosurgery

## 2014-01-28 ENCOUNTER — Other Ambulatory Visit: Payer: Self-pay | Admitting: Neurosurgery

## 2014-01-28 DIAGNOSIS — S129XXA Fracture of neck, unspecified, initial encounter: Secondary | ICD-10-CM

## 2014-01-31 ENCOUNTER — Ambulatory Visit
Admission: RE | Admit: 2014-01-31 | Discharge: 2014-01-31 | Disposition: A | Discharge status: Home / Self Care | Payer: 59 | Source: Ambulatory Visit | Attending: Neurosurgery | Admitting: Neurosurgery

## 2014-01-31 DIAGNOSIS — S129XXA Fracture of neck, unspecified, initial encounter: Secondary | ICD-10-CM

## 2015-04-24 IMAGING — RF DG CERVICAL SPINE 2 OR 3 VIEWS
1 series · 2 of 2 positions shown · non-contrast
Comparison: CT 08/12/2013

CLINICAL DATA: C5-T1 posterior fusion

EXAM:
CERVICAL SPINE - 2-3 VIEW; DG C-ARM 61-120 MIN

[Series 1: run · 2 of 2 slices shown]
[im 1/2]
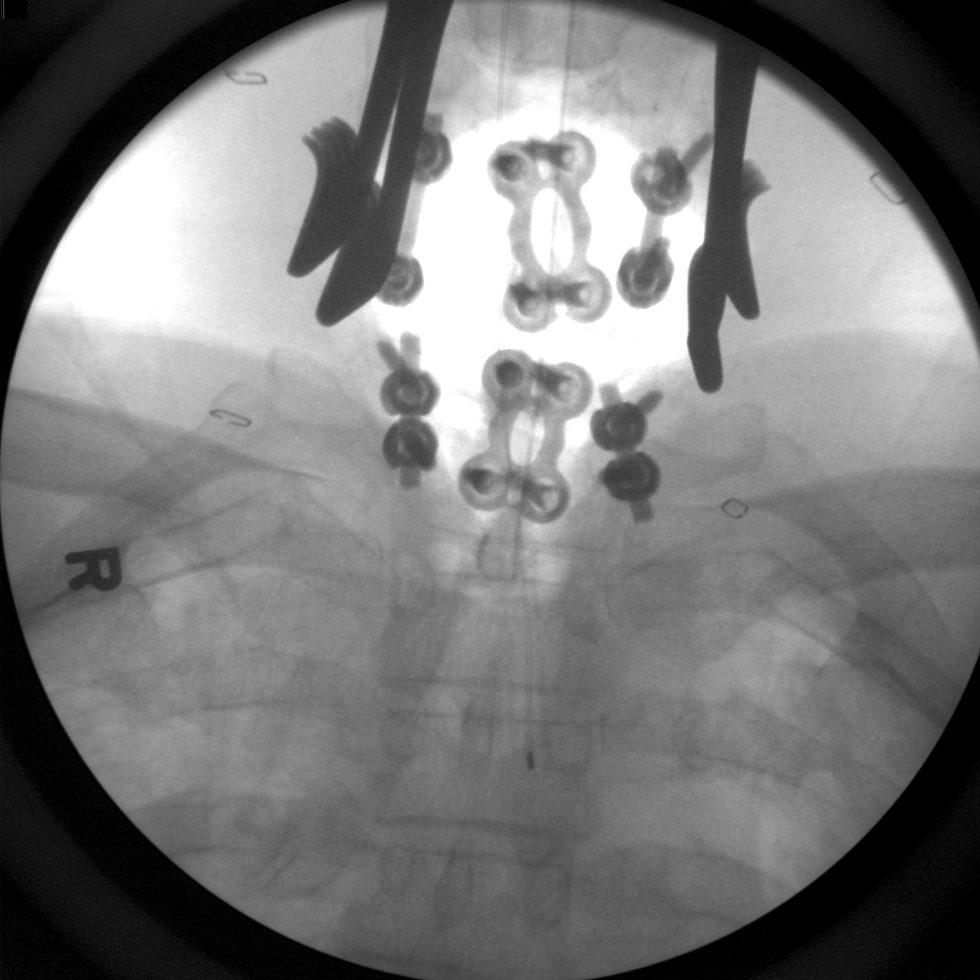
[im 2/2]
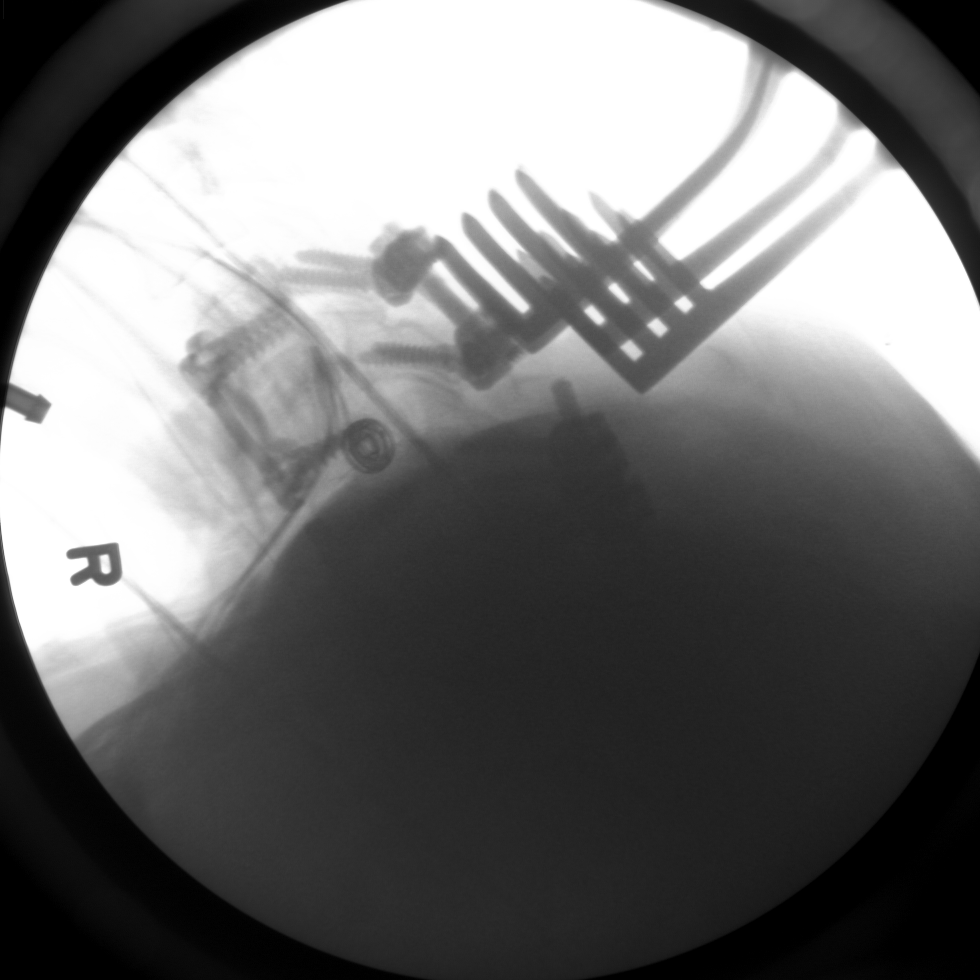

[2 of 2 positions shown; findings below may reference images not displayed]

FINDINGS: Two intraoperative spot images demonstrate changes of posterior
fusion at see 5 6 and C7-T1. These levels are difficult to visualize
on the lateral view due to overlying shoulders, particularly C7-T1.
IMPRESSION: Posterior fusion as above.

## 2015-09-08 IMAGING — CT CT CERVICAL SPINE W/O CM
4 of 5 series · 9 of 20 positions shown, 10 images · non-contrast
Comparison: DG CERVICAL SPINE COMPLETE 4+V dated 12/19/2013;

CLINICAL DATA: Neck pain and right arm pain radiating to right hand
with numbness tingling. Previous cervical fusions.

EXAM:
CT CERVICAL SPINE WITHOUT CONTRAST
TECHNIQUE: Multidetector CT imaging of the cervical spine was performed without
intravenous contrast. Multiplanar CT image reconstructions were also
generated.

[Series 2: c spine bone · axial · 0.23mm/px · z∈[+148,+251]mm · 3 of 83 slices shown, 4 images]
[im 21/83  soft-tissue]
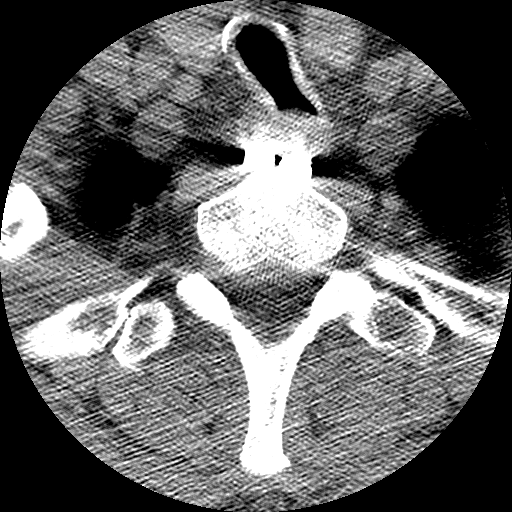
[im 21/83  bone]
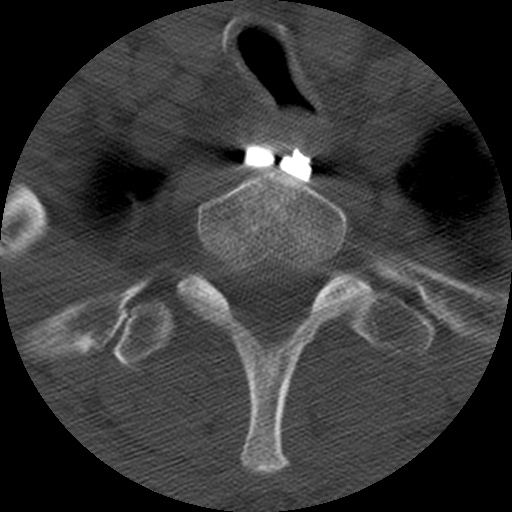
[im 42/83  bone]
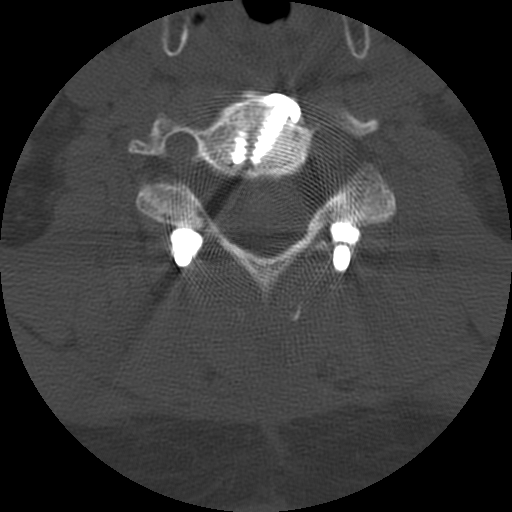
[im 62/83  bone]
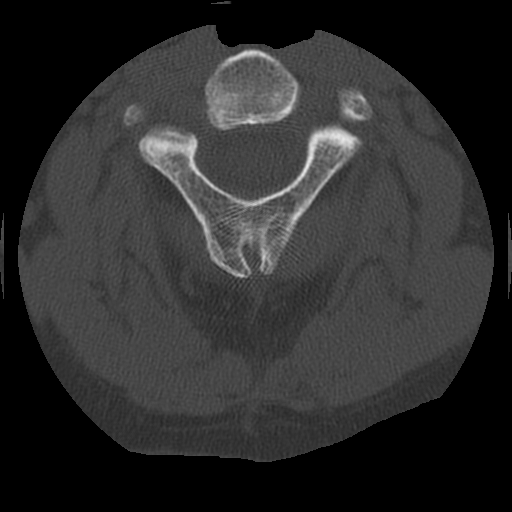

[Series 3: c spine soft · axial · 0.23mm/px · z∈[+166,+236]mm · 2 of 82 slices shown]
[im 28/82  soft-tissue]
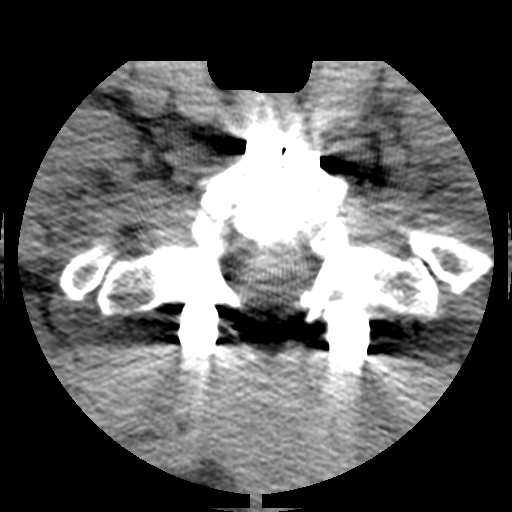
[im 55/82  soft-tissue]
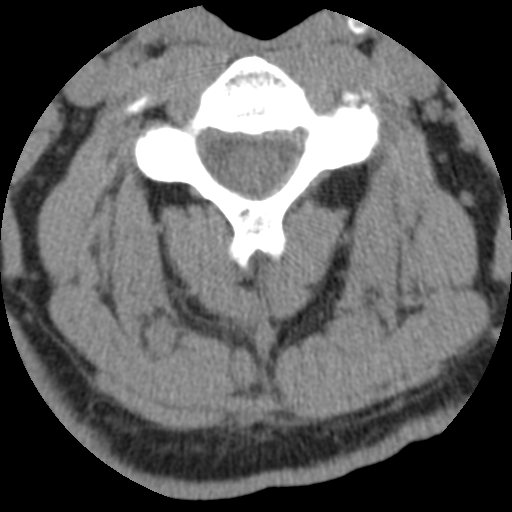

[Series 400: cor · coronal · 0.41mm/px · 1 of 46 slices shown]
[im 23/46  bone]
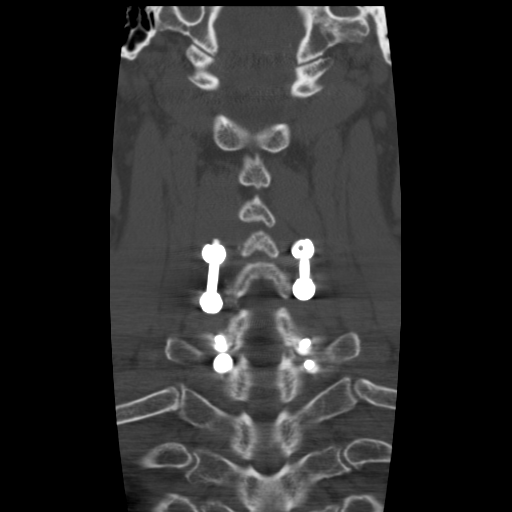

[Series 402: angled axial · axial · 0.23mm/px · z∈[+139,+237]mm · 3 of 101 slices shown]
[im 26/101  bone]
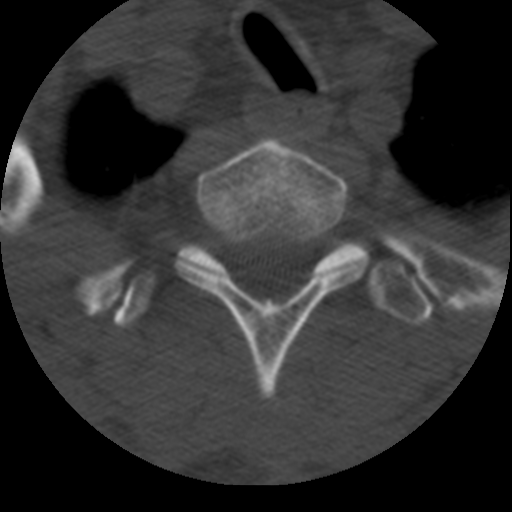
[im 51/101  bone]
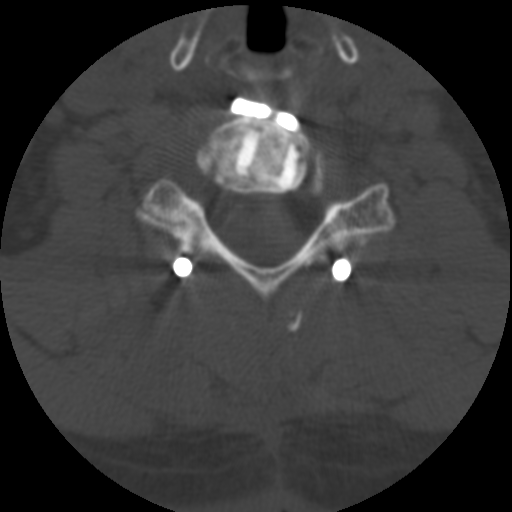
[im 76/101  bone]
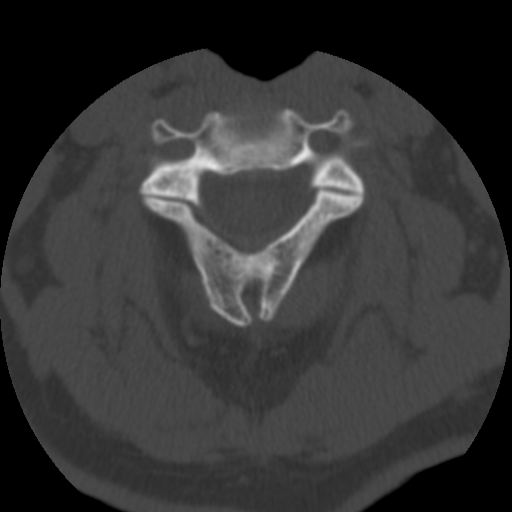

[9 of 20 positions shown; findings below may reference images not displayed]

CT C
SPINE W/O CM dated 08/12/2013; DG CERVICAL SPINE 2-3 VIEWS dated
09/16/2013; MR CERVICAL SPINE W/O CM dated 07/11/2013
FINDINGS: The patient has previously undergone interbody fusions at C5-6,
C6-7, and C7-T1. The fusion at C6-7 is solid.

Due to pseudarthrosis at C5-6 and C7-T1, the fusion was augmented
with posterior fixation at C5-6 and C7-T1. Compared with most recent
prior CT from July 2013, interbody and posterior lateral fusion
is now developing at C5-6 is 7 T1 no hardware malposition or
loosening.

Minor adjacent segment disease at C4-5 on the left consistent disc
osteophyte complex which slightly narrows the foramen. Similar
asymmetric uncinate spurring actually more prominently is noted at
C3-4 on the left. Minor uncinate spurring at C2-3 on the right and
C7-T1 bilaterally do not appear likely to cause right arm symptoms.
IMPRESSION: Developing solid fusion C5 through T1, with significant improvement
compared with July 2013. No malposition of hardware or visible
adverse features.

Minor spondylosis above the fusion most notable on the left, not
right, at C3-4 and C4-5. See discussion above

## 2016-12-28 ENCOUNTER — Telehealth (HOSPITAL_COMMUNITY): Payer: Self-pay

## 2016-12-28 NOTE — Telephone Encounter (Signed)
2/7 Left message on voicemail with call back information.  [phone] 

## 2024-10-03 DIAGNOSIS — I517 Cardiomegaly: Secondary | ICD-10-CM | POA: Diagnosis not present
